# Patient Record
Sex: Male | Born: 1960 | Race: White | Hispanic: No | Marital: Married | Smoking: Current every day smoker
Health system: Southern US, Community
[De-identification: ages and names within clinical notes are randomized; demographics above are authoritative.]

## PROBLEM LIST (undated history)

## (undated) DIAGNOSIS — Z87898 Personal history of other specified conditions: Secondary | ICD-10-CM

## (undated) HISTORY — DX: Personal history of other specified conditions: Z87.898

---

## 1998-03-28 ENCOUNTER — Emergency Department (HOSPITAL_COMMUNITY): Admission: EM | Admit: 1998-03-28 | Discharge: 1998-03-28 | Payer: Self-pay | Admitting: Internal Medicine

## 2005-01-28 ENCOUNTER — Emergency Department: Payer: Self-pay | Admitting: Emergency Medicine

## 2005-02-28 ENCOUNTER — Emergency Department: Payer: Self-pay | Admitting: Emergency Medicine

## 2005-03-15 ENCOUNTER — Emergency Department: Payer: Self-pay | Admitting: Unknown Physician Specialty

## 2010-03-14 ENCOUNTER — Emergency Department: Payer: Self-pay | Admitting: Emergency Medicine

## 2011-04-13 ENCOUNTER — Emergency Department: Payer: Self-pay | Admitting: Emergency Medicine

## 2017-09-23 ENCOUNTER — Emergency Department (HOSPITAL_COMMUNITY): Payer: No Typology Code available for payment source

## 2017-09-23 ENCOUNTER — Other Ambulatory Visit: Payer: Self-pay

## 2017-09-23 ENCOUNTER — Inpatient Hospital Stay (HOSPITAL_COMMUNITY)
Admission: EM | Admit: 2017-09-23 | Discharge: 2017-09-30 | DRG: 958 | Disposition: A | Discharge status: Home Health | Payer: No Typology Code available for payment source | Attending: General Surgery | Admitting: General Surgery

## 2017-09-23 ENCOUNTER — Encounter (HOSPITAL_COMMUNITY): Payer: Self-pay | Admitting: Emergency Medicine

## 2017-09-23 DIAGNOSIS — S064X9A Epidural hemorrhage with loss of consciousness of unspecified duration, initial encounter: Secondary | ICD-10-CM | POA: Diagnosis present

## 2017-09-23 DIAGNOSIS — S27329A Contusion of lung, unspecified, initial encounter: Secondary | ICD-10-CM | POA: Diagnosis present

## 2017-09-23 DIAGNOSIS — G8194 Hemiplegia, unspecified affecting left nondominant side: Secondary | ICD-10-CM | POA: Diagnosis present

## 2017-09-23 DIAGNOSIS — R079 Chest pain, unspecified: Secondary | ICD-10-CM

## 2017-09-23 DIAGNOSIS — S12111A Posterior displaced Type II dens fracture, initial encounter for closed fracture: Secondary | ICD-10-CM

## 2017-09-23 DIAGNOSIS — Z9889 Other specified postprocedural states: Secondary | ICD-10-CM | POA: Diagnosis present

## 2017-09-23 DIAGNOSIS — S51011A Laceration without foreign body of right elbow, initial encounter: Secondary | ICD-10-CM | POA: Diagnosis present

## 2017-09-23 DIAGNOSIS — Y92410 Unspecified street and highway as the place of occurrence of the external cause: Secondary | ICD-10-CM

## 2017-09-23 DIAGNOSIS — S129XXA Fracture of neck, unspecified, initial encounter: Secondary | ICD-10-CM

## 2017-09-23 DIAGNOSIS — F17200 Nicotine dependence, unspecified, uncomplicated: Secondary | ICD-10-CM | POA: Diagnosis present

## 2017-09-23 DIAGNOSIS — S14109A Unspecified injury at unspecified level of cervical spinal cord, initial encounter: Secondary | ICD-10-CM | POA: Diagnosis present

## 2017-09-23 DIAGNOSIS — F1490 Cocaine use, unspecified, uncomplicated: Secondary | ICD-10-CM | POA: Diagnosis present

## 2017-09-23 DIAGNOSIS — M4802 Spinal stenosis, cervical region: Secondary | ICD-10-CM | POA: Diagnosis present

## 2017-09-23 DIAGNOSIS — R52 Pain, unspecified: Secondary | ICD-10-CM

## 2017-09-23 DIAGNOSIS — S12100A Unspecified displaced fracture of second cervical vertebra, initial encounter for closed fracture: Secondary | ICD-10-CM | POA: Diagnosis present

## 2017-09-23 DIAGNOSIS — Z87828 Personal history of other (healed) physical injury and trauma: Secondary | ICD-10-CM | POA: Diagnosis present

## 2017-09-23 DIAGNOSIS — Z91013 Allergy to seafood: Secondary | ICD-10-CM

## 2017-09-23 DIAGNOSIS — Z419 Encounter for procedure for purposes other than remedying health state, unspecified: Secondary | ICD-10-CM

## 2017-09-23 LAB — CBC
HEMATOCRIT: 50.2 % (ref 39.0–52.0)
Hemoglobin: 16.3 g/dL (ref 13.0–17.0)
MCH: 29.5 pg (ref 26.0–34.0)
MCHC: 32.5 g/dL (ref 30.0–36.0)
MCV: 90.8 fL (ref 78.0–100.0)
Platelets: 325 10*3/uL (ref 150–400)
RBC: 5.53 MIL/uL (ref 4.22–5.81)
RDW: 14 % (ref 11.5–15.5)
WBC: 27.3 10*3/uL — AB (ref 4.0–10.5)

## 2017-09-23 LAB — COMPREHENSIVE METABOLIC PANEL
ALT: 20 U/L (ref 17–63)
ANION GAP: 9 (ref 5–15)
AST: 25 U/L (ref 15–41)
Albumin: 3.9 g/dL (ref 3.5–5.0)
Alkaline Phosphatase: 83 U/L (ref 38–126)
BILIRUBIN TOTAL: 0.5 mg/dL (ref 0.3–1.2)
BUN: 28 mg/dL — AB (ref 6–20)
CO2: 21 mmol/L — ABNORMAL LOW (ref 22–32)
Calcium: 8.6 mg/dL — ABNORMAL LOW (ref 8.9–10.3)
Chloride: 109 mmol/L (ref 101–111)
Creatinine, Ser: 1.02 mg/dL (ref 0.61–1.24)
GFR calc Af Amer: >60 mL/min (ref >60)
GFR calc non Af Amer: >60 mL/min (ref >60)
Glucose, Bld: 129 mg/dL — ABNORMAL HIGH (ref 65–99)
POTASSIUM: 4.1 mmol/L (ref 3.5–5.1)
Sodium: 139 mmol/L (ref 135–145)
TOTAL PROTEIN: 6.8 g/dL (ref 6.5–8.1)

## 2017-09-23 LAB — URINALYSIS, ROUTINE W REFLEX MICROSCOPIC
BILIRUBIN URINE: NEGATIVE
Glucose, UA: NEGATIVE mg/dL
Hgb urine dipstick: NEGATIVE
KETONES UR: NEGATIVE mg/dL
Leukocytes, UA: NEGATIVE
NITRITE: NEGATIVE
PROTEIN: NEGATIVE mg/dL
SPECIFIC GRAVITY, URINE: 1.014 (ref 1.005–1.030)
pH: 7 (ref 5.0–8.0)

## 2017-09-23 LAB — PROTIME-INR
INR: 0.94
PROTHROMBIN TIME: 12.4 s (ref 11.4–15.2)

## 2017-09-23 LAB — ETHANOL: Alcohol, Ethyl (B): <10 mg/dL (ref <10)

## 2017-09-23 LAB — I-STAT CG4 LACTIC ACID, ED: LACTIC ACID, VENOUS: 0.85 mmol/L (ref 0.5–1.9)

## 2017-09-23 MED ORDER — FENTANYL CITRATE (PF) 100 MCG/2ML IJ SOLN
50.0000 ug | Freq: Once | INTRAMUSCULAR | Status: AC
  Administered 2017-09-23: 50 ug via INTRAVENOUS

## 2017-09-23 MED ORDER — MIDAZOLAM HCL 2 MG/2ML IJ SOLN
4.0000 mg | Freq: Once | INTRAMUSCULAR | Status: AC | PRN
  Administered 2017-09-23: 4 mg via INTRAVENOUS
  Filled 2017-09-23: qty 4

## 2017-09-23 MED ORDER — IOPAMIDOL (ISOVUE-300) INJECTION 61%
INTRAVENOUS | Status: AC
  Administered 2017-09-23: 100 mL
  Filled 2017-09-23: qty 100

## 2017-09-23 MED ORDER — MORPHINE SULFATE (PF) 4 MG/ML IV SOLN: 4.0000 mg | Freq: Once | INTRAVENOUS | Status: DC

## 2017-09-23 MED ORDER — IOPAMIDOL (ISOVUE-370) INJECTION 76%
INTRAVENOUS | Status: AC
  Filled 2017-09-23: qty 50

## 2017-09-23 MED ORDER — NALOXONE HCL 0.4 MG/ML IJ SOLN: 0.4000 mg | INTRAMUSCULAR | Status: DC | PRN

## 2017-09-23 MED ORDER — DOCUSATE SODIUM 100 MG PO CAPS: 100.0000 mg | ORAL_CAPSULE | Freq: Two times a day (BID) | ORAL | Status: DC

## 2017-09-23 MED ORDER — FENTANYL CITRATE (PF) 100 MCG/2ML IJ SOLN
INTRAMUSCULAR | Status: AC
  Filled 2017-09-23: qty 2

## 2017-09-23 MED ORDER — HYDROMORPHONE HCL 1 MG/ML IJ SOLN
1.0000 mg | Freq: Once | INTRAMUSCULAR | Status: AC
  Administered 2017-09-23: 1 mg via INTRAVENOUS

## 2017-09-23 MED ORDER — HYDRALAZINE HCL 20 MG/ML IJ SOLN: 10.0000 mg | INTRAMUSCULAR | Status: DC | PRN

## 2017-09-23 MED ORDER — OXYCODONE HCL 5 MG PO TABS
5.0000 mg | ORAL_TABLET | ORAL | Status: DC | PRN
  Administered 2017-09-24 – 2017-09-26 (×5): 5 mg via ORAL
  Filled 2017-09-23 (×6): qty 1

## 2017-09-23 MED ORDER — ONDANSETRON HCL 4 MG/2ML IJ SOLN: 4.0000 mg | Freq: Four times a day (QID) | INTRAMUSCULAR | Status: DC | PRN

## 2017-09-23 MED ORDER — ONDANSETRON 4 MG PO TBDP: 4.0000 mg | ORAL_TABLET | Freq: Four times a day (QID) | ORAL | Status: DC | PRN

## 2017-09-23 MED ORDER — HYDROMORPHONE HCL 1 MG/ML IJ SOLN
1.0000 mg | INTRAMUSCULAR | Status: AC | PRN
  Administered 2017-09-23 – 2017-09-24 (×3): 1 mg via INTRAVENOUS
  Filled 2017-09-23 (×6): qty 1

## 2017-09-23 MED ORDER — MORPHINE SULFATE (PF) 4 MG/ML IV SOLN
2.0000 mg | INTRAVENOUS | Status: DC | PRN
  Administered 2017-09-24 – 2017-09-27 (×24): 4 mg via INTRAVENOUS
  Filled 2017-09-23 (×24): qty 1

## 2017-09-23 MED ORDER — HYDROMORPHONE HCL 1 MG/ML IJ SOLN
2.0000 mg | Freq: Once | INTRAMUSCULAR | Status: AC | PRN
  Administered 2017-09-23: 2 mg via INTRAVENOUS
  Filled 2017-09-23: qty 2

## 2017-09-23 MED ORDER — MORPHINE SULFATE (PF) 4 MG/ML IV SOLN
4.0000 mg | Freq: Once | INTRAVENOUS | Status: AC
  Administered 2017-09-23: 4 mg via INTRAVENOUS
  Filled 2017-09-23: qty 1

## 2017-09-23 MED ORDER — ACETAMINOPHEN 325 MG PO TABS: 650.0000 mg | ORAL_TABLET | ORAL | Status: DC | PRN

## 2017-09-23 MED ORDER — LIDOCAINE-EPINEPHRINE (PF) 2 %-1:200000 IJ SOLN
30.0000 mL | Freq: Once | INTRAMUSCULAR | Status: AC
  Administered 2017-09-23: 30 mL via INTRADERMAL
  Filled 2017-09-23: qty 40

## 2017-09-23 MED ORDER — SODIUM CHLORIDE 0.9 % IV BOLUS (SEPSIS)
1000.0000 mL | Freq: Once | INTRAVENOUS | Status: AC
  Administered 2017-09-23: 1000 mL via INTRAVENOUS

## 2017-09-23 NOTE — ED Notes (Signed)
Pt stated to RN "Can't I have some f&^king water?! I mean just a little bit yall are making me dye of f%$king thirst!" Pt informed he may have water after the MD sees the CT results and okays PO liquids.

## 2017-09-23 NOTE — ED Notes (Signed)
Report received, care assumed. [facility] Pt sedated/ sleeping with sonorous resps. NAD, calm, resps e/u, no dyspnea noted. VSS. C-collar remains. Head maintained in traction. HOB 30 degrees. [facility] Pending admission orders.

## 2017-09-23 NOTE — ED Notes (Signed)
Garnder-wells traction placed with 5lbs weight
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
## 2017-09-23 NOTE — Progress Notes (Signed)
Orthopedic Tech Progress Note Patient Details:  Frederick EtienneBryan Warren [birth year] [id number]  Musculoskeletal Traction Type of Traction: Other (Comment) Traction Location: head Traction Weight: 5 lbs   Post Interventions Patient Tolerated: Well Instructions Provided: Care of device   Jennye MoccasinHughes, [name] 09/23/2017, 11:22 PM

## 2017-09-23 NOTE — ED Notes (Signed)
Patient transported to CT and xray 

## 2017-09-23 NOTE — ED Notes (Signed)
Against advisory pt removed c-collar because "I couldn't f&^%ing breathe with it on"

## 2017-09-23 NOTE — ED Notes (Signed)
MRI will get pt for scan in 20 mins Message sent to pharmacy to verify pain meds

## 2017-09-23 NOTE — ED Notes (Signed)
Ortho tech not sure where to get [employer] traction tongs, OR contacted and will call back

## 2017-09-23 NOTE — Procedures (Signed)
Consent was obtained. The patient was sedated. The supraauricular area was cleaned with chlorhexidine and infiltrated with lidocaine 1% with 1:100,00 epi. Tongs were placed until appropriately tightened.  They were secured with the locking nuts. 5 lbs of weight was added to the traction mechanism. The patient tolerated the procedure well.

## 2017-09-23 NOTE — ED Notes (Signed)
Patient transported to CT 

## 2017-09-23 NOTE — ED Notes (Signed)
MD, ortho tech, and PA bedside to initiate traction
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
## 2017-09-24 ENCOUNTER — Inpatient Hospital Stay (HOSPITAL_COMMUNITY): Payer: No Typology Code available for payment source

## 2017-09-24 ENCOUNTER — Other Ambulatory Visit: Payer: Self-pay | Admitting: Neurological Surgery

## 2017-09-24 ENCOUNTER — Encounter (HOSPITAL_COMMUNITY): Payer: Self-pay | Admitting: Emergency Medicine

## 2017-09-24 LAB — MRSA PCR SCREENING: MRSA by PCR: NEGATIVE

## 2017-09-24 LAB — HIV ANTIBODY (ROUTINE TESTING W REFLEX): HIV Screen 4th Generation wRfx: NONREACTIVE

## 2017-09-24 MED ORDER — LORAZEPAM 2 MG/ML IJ SOLN
2.0000 mg | INTRAMUSCULAR | Status: DC | PRN
  Administered 2017-09-27 (×2): 2 mg via INTRAVENOUS
  Filled 2017-09-24: qty 2
  Filled 2017-09-24 (×2): qty 1

## 2017-09-24 MED ORDER — FOLIC ACID 5 MG/ML IJ SOLN
1.0000 mg | Freq: Every day | INTRAMUSCULAR | Status: DC
  Administered 2017-09-25: 1 mg via INTRAVENOUS
  Filled 2017-09-24 (×3): qty 0.2

## 2017-09-24 MED ORDER — THIAMINE HCL 100 MG/ML IJ SOLN
100.0000 mg | Freq: Every day | INTRAMUSCULAR | Status: DC
Start: 2017-09-24 — End: 2017-09-25
  Administered 2017-09-24 – 2017-09-25 (×2): 100 mg via INTRAVENOUS
  Filled 2017-09-24 (×2): qty 2

## 2017-09-24 MED ORDER — DIAZEPAM 1 MG/ML PO SOLN
5.0000 mg | Freq: Four times a day (QID) | ORAL | Status: DC
  Administered 2017-09-24 – 2017-09-27 (×12): 5 mg via ORAL
  Filled 2017-09-24 (×13): qty 5

## 2017-09-24 MED ORDER — DOCUSATE SODIUM 50 MG/5ML PO LIQD
100.0000 mg | Freq: Two times a day (BID) | ORAL | Status: DC
  Administered 2017-09-24 – 2017-09-27 (×6): 100 mg via ORAL
  Filled 2017-09-24 (×6): qty 10

## 2017-09-24 MED ORDER — DEXTROSE 5 % IV SOLN
750.0000 mg | Freq: Four times a day (QID) | INTRAVENOUS | Status: DC
  Administered 2017-09-24 – 2017-09-27 (×13): 750 mg via INTRAVENOUS
  Filled 2017-09-24 (×16): qty 7.5
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
## 2017-09-24 NOTE — ED Notes (Signed)
Pt c/o tingling in left arm.  Pt moving all extremities.  Left hand grip weaker than right.
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
## 2017-09-24 NOTE — Progress Notes (Signed)
Complains of pain Neurologically unchanged; persistent left arm weakness Xray shows no reduction with 5 pounds of traction, increased to 10 pounds. Will check xray this afternoon and increase to 15 pounds if no change.  Will check additional xray in the morning. If he reduces will plan odontoid screw fixation, if he does not reduce will perform dorsal cervical decompression and fusion.  Likely Tuesday.

## 2017-09-25 ENCOUNTER — Inpatient Hospital Stay (HOSPITAL_COMMUNITY): Payer: No Typology Code available for payment source

## 2017-09-25 ENCOUNTER — Other Ambulatory Visit: Payer: Self-pay

## 2017-09-25 MED ORDER — VITAMIN B-1 100 MG PO TABS
100.0000 mg | ORAL_TABLET | Freq: Every day | ORAL | Status: DC
  Administered 2017-09-26 – 2017-09-30 (×5): 100 mg via ORAL
  Filled 2017-09-25 (×6): qty 1

## 2017-09-25 MED ORDER — FOLIC ACID 1 MG PO TABS
1.0000 mg | ORAL_TABLET | Freq: Every day | ORAL | Status: DC
  Administered 2017-09-26 – 2017-09-30 (×5): 1 mg via ORAL
  Filled 2017-09-25 (×5): qty 1
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
## 2017-09-25 NOTE — Progress Notes (Signed)
PT Cancellation Note  Patient Details Name: Rolla EtienneBryan Bergren MRN: [id number] DOB: [birth year]   Cancelled Treatment:    Reason Eval/Treat Not Completed: Medical issues which prohibited therapy  Has to stay immobile. 09/25/2017  Birgit [place] BingKen Tariya Morrissette, PT [phone]  (pager)   Eliseo GumKenneth V Vola Beneke 09/25/2017, 11:20 AM
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
## 2017-09-26 LAB — CBC WITH DIFFERENTIAL/PLATELET
Basophils Absolute: 0 10*3/uL (ref 0.0–0.1)
Basophils Relative: 0 %
Eosinophils Absolute: 0.1 10*3/uL (ref 0.0–0.7)
Eosinophils Relative: 1 %
HEMATOCRIT: 47.9 % (ref 39.0–52.0)
HEMOGLOBIN: 16.1 g/dL (ref 13.0–17.0)
LYMPHS ABS: 2.1 10*3/uL (ref 0.7–4.0)
LYMPHS PCT: 17 %
MCH: 30.7 pg (ref 26.0–34.0)
MCHC: 33.6 g/dL (ref 30.0–36.0)
MCV: 91.4 fL (ref 78.0–100.0)
Monocytes Absolute: 1.4 10*3/uL (ref 0.1–1.0)
Monocytes Relative: 12 %
NEUTROS ABS: 8.7 10*3/uL (ref 1.7–7.7)
NEUTROS PCT: 70 %
Platelets: 268 10*3/uL (ref 150–400)
RBC: 5.24 MIL/uL (ref 4.22–5.81)
RDW: 13.8 % (ref 11.5–15.5)
WBC: 12.3 10*3/uL — ABNORMAL HIGH (ref 4.0–10.5)

## 2017-09-26 LAB — BASIC METABOLIC PANEL
Anion gap: 10 (ref 5–15)
BUN: 16 mg/dL (ref 6–20)
CHLORIDE: 99 mmol/L — AB (ref 101–111)
CO2: 24 mmol/L (ref 22–32)
CREATININE: 0.84 mg/dL (ref 0.61–1.24)
Calcium: 8.9 mg/dL (ref 8.9–10.3)
GFR calc Af Amer: >60 mL/min (ref >60)
GFR calc non Af Amer: >60 mL/min (ref >60)
GLUCOSE: 139 mg/dL — AB (ref 65–99)
Potassium: 3.9 mmol/L (ref 3.5–5.1)
Sodium: 133 mmol/L — ABNORMAL LOW (ref 135–145)

## 2017-09-26 MED ORDER — DIPHENHYDRAMINE HCL 50 MG/ML IJ SOLN
50.0000 mg | Freq: Four times a day (QID) | INTRAMUSCULAR | Status: DC | PRN
  Administered 2017-09-26 – 2017-09-28 (×2): 50 mg via INTRAVENOUS
  Filled 2017-09-26 (×2): qty 1

## 2017-09-26 NOTE — Progress Notes (Signed)
Subjective: The patient is alert.  His mother is at the bedside.  [facility] is frustrated [facility]  Objective: Vital signs in last 24 hours: Temp:  [98 F (36.7 C)-98.7 F (37.1 C)] 98.4 F (36.9 C) (01/20 0800) Pulse Rate:  [93-106] 105 (01/20 0800) Resp:  [9-14] 9 (01/20 0800) BP: (120-140)/(78-90) 120/87 (01/20 0800) SpO2:  [95 %-99 %] 97 % (01/20 0800) Estimated body mass index is 21.99 kg/m as calculated from the following:   Height as of this encounter: 5\' 7"  (1.702 m).   Weight as of this encounter: 63.7 kg (140 lb 6.9 oz).   Intake/Output from previous day: 01/19 0701 - 01/20 0700 In: 380 [P.O.:150; IV Piggyback:230] Out: 1235 [Urine:1235] Intake/Output this shift: Total I/O In: 0  Out: 150 [Urine:150]  Physical exam the patient is alert and oriented.  Moving all 4 extremities.  His left hand continues to be weak.  The Gardner-Wells traction is in place.  Lab Results: Recent Labs    09/23/17 1658  WBC 27.3*  HGB 16.3  HCT 50.2  PLT 325   BMET Recent Labs    09/23/17 1658  NA 139  K 4.1  CL 109  CO2 21*  GLUCOSE 129*  BUN 28*  CREATININE 1.02  CALCIUM 8.6*    Studies/Results: Dg Spine Portable 1 View  Result Date: 09/25/2017 CLINICAL DATA:  Cervical spine fracture. EXAM: PORTABLE SPINE - 1 VIEW COMPARISON:  CT, MRI, and radiography from yesterday and the day prior. FINDINGS: Minimal if any reduction of type 2 dens fracture that is posteriorly displaced by approximately 7 mm. No new osseous abnormality. IMPRESSION: Type 2 dens fracture with posterior displacement that is similar to immediate prior and improved from admission. Electronically Signed   By: Marnee SpringJonathon  Watts M.D.   On: 09/25/2017 07:52   Dg Spine Portable 1 View  Result Date: 09/24/2017 CLINICAL DATA:  Odontoid fracture. EXAM: PORTABLE SPINE - 1 VIEW COMPARISON:  09/24/2017, 0600 FINDINGS: Posterior displacement of the odontoid fragment appears less prominent  and now measures approximately 10 mm compared to 13 mm on the prior film. No other fractures identified. IMPRESSION: Less prominent posterior displacement of the odontoid fragment. Electronically Signed   By: Irish LackGlenn  Yamagata M.D.   On: 09/24/2017 15:08    Assessment/Plan: Type II odontoid fracture: Dr. Bevely Palmeritty will be by tomorrow to discuss surgery with him.  I told the patient that he, or his family, [facility] if he so desires.  LOS: 3 days     Cristi LoronJeffrey D Lester Platas 09/26/2017, 9:02 AM     Patient ID: Frederick Warren, male   DOB: 1960-09-27, 57 y.o.   MRN: [id number]

## 2017-09-26 NOTE — Plan of Care (Signed)
  Clinical Measurements: Ability to maintain clinical measurements within normal limits will improve 09/26/2017 2039 - Progressing by Luther Redourgott, Kaleb Sek, RN   Coping: Level of anxiety will decrease 09/26/2017 2039 - Progressing by Luther Redourgott, Lundynn Cohoon, RN   Pain Managment: General experience of comfort will improve 09/26/2017 2039 - Progressing by Luther Redourgott, Ewel Lona, RN

## 2017-09-26 NOTE — Progress Notes (Signed)
PT Cancellation Note  Patient Details Name: Frederick Warren MRN: [id number] DOB: 03/25/1961   Cancelled Treatment:    Reason Eval/Treat Not Completed: Medical issues which prohibited therapy   Pt must continue to remain immobilized in traction; noted for surgery this upcoming week;   Frederick Warren, PT  Acute Rehabilitation Services Pager [phone] Office [phone]    Frederick Warren 09/26/2017, 8:36 AM
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
## 2017-09-26 NOTE — Progress Notes (Signed)
Trauma Service Note  Subjective: Patient on the schedule for 11:00 AM tomorrow.  Objective: Vital signs in last 24 hours: Temp:  [98 F (36.7 C)-98.7 F (37.1 C)] 98.4 F (36.9 C) (01/20 0800) Pulse Rate:  [93-106] 105 (01/20 0800) Resp:  [9-14] 9 (01/20 0800) BP: (120-140)/(78-90) 120/87 (01/20 0800) SpO2:  [95 %-99 %] 97 % (01/20 0800) Last BM Date: 09/23/17  Intake/Output from previous day: 01/19 0701 - 01/20 0700 In: 380 [P.O.:150; IV Piggyback:230] Out: 1235 [Urine:1235] Intake/Output this shift: Total I/O In: 57.5 [IV Piggyback:57.5] Out: 150 [Urine:150]  General: No distress  Lungs: Clear  Abd: Benign  Extremities: No changes  Neuro: Intact  Lab Results: CBC  Recent Labs    09/23/17 1658  WBC 27.3*  HGB 16.3  HCT 50.2  PLT 325   BMET Recent Labs    09/23/17 1658  NA 139  K 4.1  CL 109  CO2 21*  GLUCOSE 129*  BUN 28*  CREATININE 1.02  CALCIUM 8.6*   PT/INR Recent Labs    09/23/17 1658  LABPROT 12.4  INR 0.94   ABG No results for input(s): PHART, HCO3 in the last 72 hours.  Invalid input(s): PCO2, PO2  Studies/Results: Dg Spine Portable 1 View  Result Date: 09/25/2017 CLINICAL DATA:  Cervical spine fracture. EXAM: PORTABLE SPINE - 1 VIEW COMPARISON:  CT, MRI, and radiography from yesterday and the day prior. FINDINGS: Minimal if any reduction of type 2 dens fracture that is posteriorly displaced by approximately 7 mm. No new osseous abnormality. IMPRESSION: Type 2 dens fracture with posterior displacement that is similar to immediate prior and improved from admission. Electronically Signed   By: Marnee SpringJonathon  Watts M.D.   On: 09/25/2017 07:52   Dg Spine Portable 1 View  Result Date: 09/24/2017 CLINICAL DATA:  Odontoid fracture. EXAM: PORTABLE SPINE - 1 VIEW COMPARISON:  09/24/2017, 0600 FINDINGS: Posterior displacement of the odontoid fragment appears less prominent and now measures approximately 10 mm compared to 13 mm on the prior film.  No other fractures identified. IMPRESSION: Less prominent posterior displacement of the odontoid fragment. Electronically Signed   By: Irish LackGlenn  Yamagata M.D.   On: 09/24/2017 15:08    Anti-infectives: Anti-infectives (From admission, onward)   None      Assessment/Plan: s/p Procedure(s): ODONTOID SCREW Fixation vs. C1-2 Posterior Cerivcal Decompression with Fixation NPO after midnight.  LOS: 3 days   Frederick LamasJames O. Gae Warren, III, MD, FACS [phone] Trauma Surgeon 09/26/2017

## 2017-09-27 ENCOUNTER — Encounter (HOSPITAL_COMMUNITY): Admission: EM | Disposition: A | Discharge status: Home Health | Payer: Self-pay | Source: Home / Self Care

## 2017-09-27 ENCOUNTER — Encounter (HOSPITAL_COMMUNITY): Payer: Self-pay | Admitting: Certified Registered Nurse Anesthetist

## 2017-09-27 ENCOUNTER — Inpatient Hospital Stay (HOSPITAL_COMMUNITY): Payer: No Typology Code available for payment source | Admitting: Certified Registered Nurse Anesthetist

## 2017-09-27 ENCOUNTER — Inpatient Hospital Stay (HOSPITAL_COMMUNITY): Payer: No Typology Code available for payment source

## 2017-09-27 HISTORY — PX: ODONTOID SCREW INSERTION: SHX5704

## 2017-09-27 LAB — BASIC METABOLIC PANEL
Anion gap: 10 (ref 5–15)
BUN: 17 mg/dL (ref 6–20)
CHLORIDE: 100 mmol/L — AB (ref 101–111)
CO2: 25 mmol/L (ref 22–32)
Calcium: 8.9 mg/dL (ref 8.9–10.3)
Creatinine, Ser: 0.85 mg/dL (ref 0.61–1.24)
GFR calc Af Amer: >60 mL/min (ref >60)
GLUCOSE: 107 mg/dL — AB (ref 65–99)
POTASSIUM: 4 mmol/L (ref 3.5–5.1)
Sodium: 135 mmol/L (ref 135–145)

## 2017-09-27 LAB — CBC WITH DIFFERENTIAL/PLATELET
Basophils Absolute: 0 10*3/uL (ref 0.0–0.1)
Basophils Relative: 0 %
EOS PCT: 1 %
Eosinophils Absolute: 0.2 10*3/uL (ref 0.0–0.7)
HCT: 47.7 % (ref 39.0–52.0)
Hemoglobin: 16.1 g/dL (ref 13.0–17.0)
Lymphocytes Relative: 17 %
Lymphs Abs: 2.2 10*3/uL (ref 0.7–4.0)
MCH: 30.6 pg (ref 26.0–34.0)
MCHC: 33.8 g/dL (ref 30.0–36.0)
MCV: 90.7 fL (ref 78.0–100.0)
Monocytes Absolute: 2.1 10*3/uL — ABNORMAL HIGH (ref 0.1–1.0)
Monocytes Relative: 16 %
Neutro Abs: 8.6 10*3/uL — ABNORMAL HIGH (ref 1.7–7.7)
Neutrophils Relative %: 66 %
PLATELETS: 268 10*3/uL (ref 150–400)
RBC: 5.26 MIL/uL (ref 4.22–5.81)
RDW: 13.4 % (ref 11.5–15.5)
WBC: 13.1 10*3/uL — AB (ref 4.0–10.5)

## 2017-09-27 SURGERY — INSERTION, SCREW, ODONTOID
Anesthesia: General | Site: Neck

## 2017-09-27 MED ORDER — DIAZEPAM 5 MG PO TABS
5.0000 mg | ORAL_TABLET | Freq: Four times a day (QID) | ORAL | Status: DC | PRN
  Administered 2017-09-28 – 2017-09-29 (×2): 5 mg via ORAL
  Filled 2017-09-27 (×2): qty 1

## 2017-09-27 MED ORDER — SODIUM CHLORIDE 0.9 % IR SOLN
Status: DC | PRN
  Administered 2017-09-27: 500 mL

## 2017-09-27 MED ORDER — KETAMINE HCL 10 MG/ML IJ SOLN
INTRAMUSCULAR | Status: DC | PRN
  Administered 2017-09-27: 30 mg via INTRAVENOUS
  Administered 2017-09-27: 10 mg via INTRAVENOUS

## 2017-09-27 MED ORDER — BUPIVACAINE-EPINEPHRINE (PF) 0.5% -1:200000 IJ SOLN
INTRAMUSCULAR | Status: AC
  Filled 2017-09-27: qty 30

## 2017-09-27 MED ORDER — PHENOL 1.4 % MT LIQD: 1.0000 | OROMUCOSAL | Status: DC | PRN

## 2017-09-27 MED ORDER — MENTHOL 3 MG MT LOZG: 1.0000 | LOZENGE | OROMUCOSAL | Status: DC | PRN

## 2017-09-27 MED ORDER — OXYCODONE HCL ER 15 MG PO T12A
15.0000 mg | EXTENDED_RELEASE_TABLET | Freq: Two times a day (BID) | ORAL | Status: DC
  Administered 2017-09-27 – 2017-09-30 (×6): 15 mg via ORAL
  Filled 2017-09-27 (×6): qty 1

## 2017-09-27 MED ORDER — DEXAMETHASONE SODIUM PHOSPHATE 10 MG/ML IJ SOLN
INTRAMUSCULAR | Status: DC | PRN
  Administered 2017-09-27: 10 mg via INTRAVENOUS

## 2017-09-27 MED ORDER — CEFAZOLIN SODIUM-DEXTROSE 2-4 GM/100ML-% IV SOLN
2.0000 g | INTRAVENOUS | Status: AC
  Administered 2017-09-27: 2 g via INTRAVENOUS
  Filled 2017-09-27 (×2): qty 100

## 2017-09-27 MED ORDER — FLEET ENEMA 7-19 GM/118ML RE ENEM: 1.0000 | ENEMA | Freq: Once | RECTAL | Status: DC | PRN

## 2017-09-27 MED ORDER — GABAPENTIN 300 MG PO CAPS
300.0000 mg | ORAL_CAPSULE | Freq: Three times a day (TID) | ORAL | Status: DC
Start: 2017-09-27 — End: 2017-09-30
  Administered 2017-09-27 – 2017-09-30 (×9): 300 mg via ORAL
  Filled 2017-09-27 (×9): qty 1

## 2017-09-27 MED ORDER — MORPHINE SULFATE (PF) 4 MG/ML IV SOLN
2.0000 mg | INTRAVENOUS | Status: DC | PRN
  Administered 2017-09-27 – 2017-09-28 (×2): 4 mg via INTRAVENOUS
  Filled 2017-09-27 (×2): qty 1

## 2017-09-27 MED ORDER — LIDOCAINE HCL (CARDIAC) 20 MG/ML IV SOLN
INTRAVENOUS | Status: DC | PRN
  Administered 2017-09-27: 200 mg via INTRATRACHEAL

## 2017-09-27 MED ORDER — KETAMINE HCL-SODIUM CHLORIDE 100-0.9 MG/10ML-% IV SOSY
PREFILLED_SYRINGE | INTRAVENOUS | Status: AC
  Filled 2017-09-27: qty 10

## 2017-09-27 MED ORDER — PHENYLEPHRINE HCL 10 MG/ML IJ SOLN
INTRAVENOUS | Status: DC | PRN
  Administered 2017-09-27: 20 ug/min via INTRAVENOUS

## 2017-09-27 MED ORDER — MIDAZOLAM HCL 5 MG/5ML IJ SOLN
INTRAMUSCULAR | Status: DC | PRN
  Administered 2017-09-27: 1 mg via INTRAVENOUS

## 2017-09-27 MED ORDER — PANTOPRAZOLE SODIUM 40 MG IV SOLR
40.0000 mg | Freq: Every day | INTRAVENOUS | Status: DC
  Administered 2017-09-27 – 2017-09-28 (×2): 40 mg via INTRAVENOUS
  Filled 2017-09-27 (×2): qty 40

## 2017-09-27 MED ORDER — ONDANSETRON HCL 4 MG PO TABS: 4.0000 mg | ORAL_TABLET | Freq: Four times a day (QID) | ORAL | Status: DC | PRN

## 2017-09-27 MED ORDER — LIDOCAINE-EPINEPHRINE 2 %-1:100000 IJ SOLN
INTRAMUSCULAR | Status: AC
  Filled 2017-09-27: qty 1

## 2017-09-27 MED ORDER — DEXAMETHASONE SODIUM PHOSPHATE 4 MG/ML IJ SOLN: 2.0000 mg | Freq: Three times a day (TID) | INTRAMUSCULAR | Status: DC

## 2017-09-27 MED ORDER — THROMBIN (RECOMBINANT) 5000 UNITS EX SOLR
OROMUCOSAL | Status: DC | PRN
  Administered 2017-09-27: 5 mL via TOPICAL

## 2017-09-27 MED ORDER — ONDANSETRON HCL 4 MG/2ML IJ SOLN: 4.0000 mg | Freq: Four times a day (QID) | INTRAMUSCULAR | Status: DC | PRN

## 2017-09-27 MED ORDER — DOCUSATE SODIUM 100 MG PO CAPS
100.0000 mg | ORAL_CAPSULE | Freq: Two times a day (BID) | ORAL | Status: DC
  Administered 2017-09-28 – 2017-09-30 (×5): 100 mg via ORAL
  Filled 2017-09-27 (×6): qty 1

## 2017-09-27 MED ORDER — PHENYLEPHRINE HCL 10 MG/ML IJ SOLN
INTRAMUSCULAR | Status: DC | PRN
  Administered 2017-09-27: 120 ug via INTRAVENOUS
  Administered 2017-09-27: 10 ug via INTRAVENOUS
  Administered 2017-09-27 (×3): 80 ug via INTRAVENOUS

## 2017-09-27 MED ORDER — SODIUM CHLORIDE 0.9% FLUSH: 3.0000 mL | INTRAVENOUS | Status: DC | PRN

## 2017-09-27 MED ORDER — ZOLPIDEM TARTRATE 5 MG PO TABS
5.0000 mg | ORAL_TABLET | Freq: Every evening | ORAL | Status: DC | PRN
  Administered 2017-09-29 (×2): 5 mg via ORAL
  Filled 2017-09-27 (×2): qty 1

## 2017-09-27 MED ORDER — FENTANYL CITRATE (PF) 250 MCG/5ML IJ SOLN
INTRAMUSCULAR | Status: AC
Start: 2017-09-27 — End: ?
  Filled 2017-09-27: qty 5

## 2017-09-27 MED ORDER — ACETAMINOPHEN 325 MG PO TABS: 650.0000 mg | ORAL_TABLET | ORAL | Status: DC

## 2017-09-27 MED ORDER — PROPOFOL 10 MG/ML IV BOLUS
INTRAVENOUS | Status: AC
  Filled 2017-09-27: qty 20

## 2017-09-27 MED ORDER — SODIUM CHLORIDE 0.9% FLUSH
3.0000 mL | Freq: Two times a day (BID) | INTRAVENOUS | Status: DC
  Administered 2017-09-27 – 2017-09-30 (×6): 3 mL via INTRAVENOUS

## 2017-09-27 MED ORDER — SUGAMMADEX SODIUM 200 MG/2ML IV SOLN
INTRAVENOUS | Status: DC | PRN
  Administered 2017-09-27: 150 mg via INTRAVENOUS

## 2017-09-27 MED ORDER — MIDAZOLAM HCL 2 MG/2ML IJ SOLN
INTRAMUSCULAR | Status: AC
  Filled 2017-09-27: qty 2

## 2017-09-27 MED ORDER — LACTATED RINGERS IV SOLN
INTRAVENOUS | Status: DC
  Administered 2017-09-27 (×2): via INTRAVENOUS

## 2017-09-27 MED ORDER — CEFAZOLIN SODIUM-DEXTROSE 1-4 GM/50ML-% IV SOLN
1.0000 g | Freq: Three times a day (TID) | INTRAVENOUS | Status: AC
  Administered 2017-09-27 – 2017-09-28 (×2): 1 g via INTRAVENOUS
  Filled 2017-09-27 (×2): qty 50

## 2017-09-27 MED ORDER — METHOCARBAMOL 750 MG PO TABS
750.0000 mg | ORAL_TABLET | Freq: Four times a day (QID) | ORAL | Status: DC
  Administered 2017-09-27 – 2017-09-30 (×10): 750 mg via ORAL
  Filled 2017-09-27 (×4): qty 1
  Filled 2017-09-27: qty 2
  Filled 2017-09-27 (×2): qty 1
  Filled 2017-09-27: qty 2
  Filled 2017-09-27: qty 1
  Filled 2017-09-27 (×3): qty 2
  Filled 2017-09-27: qty 1.5
  Filled 2017-09-27: qty 2
  Filled 2017-09-27: qty 1

## 2017-09-27 MED ORDER — SODIUM CHLORIDE 0.9 % IV SOLN: 250.0000 mL | INTRAVENOUS | Status: DC

## 2017-09-27 MED ORDER — DEXAMETHASONE 4 MG PO TABS
4.0000 mg | ORAL_TABLET | Freq: Four times a day (QID) | ORAL | Status: AC
  Administered 2017-09-27 (×2): 4 mg via ORAL
  Filled 2017-09-27 (×2): qty 1

## 2017-09-27 MED ORDER — OXYCODONE HCL 5 MG PO TABS: 5.0000 mg | ORAL_TABLET | ORAL | Status: DC | PRN

## 2017-09-27 MED ORDER — ACETAMINOPHEN 325 MG PO TABS
650.0000 mg | ORAL_TABLET | ORAL | Status: DC | PRN
Start: 2017-09-27 — End: 2017-09-30
  Filled 2017-09-27: qty 2

## 2017-09-27 MED ORDER — THROMBIN (RECOMBINANT) 5000 UNITS EX SOLR
CUTANEOUS | Status: DC | PRN
  Administered 2017-09-27: 5000 [IU] via TOPICAL

## 2017-09-27 MED ORDER — SODIUM CHLORIDE 0.9 % IV SOLN
INTRAVENOUS | Status: DC
  Administered 2017-09-27 (×2): via INTRAVENOUS

## 2017-09-27 MED ORDER — THROMBIN (RECOMBINANT) 5000 UNITS EX SOLR
CUTANEOUS | Status: AC
  Filled 2017-09-27: qty 15000

## 2017-09-27 MED ORDER — LIDOCAINE-EPINEPHRINE 2 %-1:100000 IJ SOLN
INTRAMUSCULAR | Status: DC | PRN
  Administered 2017-09-27: 6 mL

## 2017-09-27 MED ORDER — ONDANSETRON HCL 4 MG/2ML IJ SOLN
INTRAMUSCULAR | Status: DC | PRN
  Administered 2017-09-27: 4 mg via INTRAVENOUS

## 2017-09-27 MED ORDER — ACETAMINOPHEN 500 MG PO TABS
1000.0000 mg | ORAL_TABLET | Freq: Four times a day (QID) | ORAL | Status: DC
  Administered 2017-09-27 – 2017-09-30 (×11): 1000 mg via ORAL
  Filled 2017-09-27 (×11): qty 2

## 2017-09-27 MED ORDER — SENNA 8.6 MG PO TABS
1.0000 | ORAL_TABLET | Freq: Two times a day (BID) | ORAL | Status: DC
  Administered 2017-09-27 – 2017-09-30 (×6): 8.6 mg via ORAL
  Filled 2017-09-27 (×6): qty 1

## 2017-09-27 MED ORDER — PROPOFOL 10 MG/ML IV BOLUS
INTRAVENOUS | Status: DC | PRN
  Administered 2017-09-27: 60 mg via INTRAVENOUS

## 2017-09-27 MED ORDER — ACETAMINOPHEN 650 MG RE SUPP: 650.0000 mg | RECTAL | Status: DC | PRN

## 2017-09-27 MED ORDER — BISACODYL 10 MG RE SUPP: 10.0000 mg | Freq: Every day | RECTAL | Status: DC | PRN

## 2017-09-27 MED ORDER — OXYCODONE HCL 5 MG PO TABS
10.0000 mg | ORAL_TABLET | ORAL | Status: DC | PRN
  Administered 2017-09-28 – 2017-09-30 (×11): 10 mg via ORAL
  Filled 2017-09-27 (×11): qty 2

## 2017-09-27 MED ORDER — 0.9 % SODIUM CHLORIDE (POUR BTL) OPTIME
TOPICAL | Status: DC | PRN
  Administered 2017-09-27: 1000 mL

## 2017-09-27 MED ORDER — FENTANYL CITRATE (PF) 100 MCG/2ML IJ SOLN
INTRAMUSCULAR | Status: DC | PRN
  Administered 2017-09-27 (×2): 50 ug via INTRAVENOUS

## 2017-09-27 MED ORDER — ROCURONIUM BROMIDE 100 MG/10ML IV SOLN
INTRAVENOUS | Status: DC | PRN
  Administered 2017-09-27: 60 mg via INTRAVENOUS

## 2017-09-27 MED ORDER — CELECOXIB 200 MG PO CAPS
200.0000 mg | ORAL_CAPSULE | Freq: Two times a day (BID) | ORAL | Status: DC
Start: 2017-09-27 — End: 2017-09-30
  Administered 2017-09-28 – 2017-09-30 (×5): 200 mg via ORAL
  Filled 2017-09-27 (×7): qty 1

## 2017-09-27 MED ORDER — THROMBIN (RECOMBINANT) 5000 UNITS EX SOLR
CUTANEOUS | Status: AC
  Filled 2017-09-27: qty 5000

## 2017-09-27 MED ORDER — DEXAMETHASONE SODIUM PHOSPHATE 4 MG/ML IJ SOLN
2.0000 mg | Freq: Four times a day (QID) | INTRAMUSCULAR | Status: AC
  Filled 2017-09-27: qty 1

## 2017-09-27 SURGICAL SUPPLY — 60 items
BIT DRILL UCSC CANN STRL (BIT) ×1 IMPLANT
BUR MATCHSTICK NEURO 3.0 LAGG (BURR) ×3 IMPLANT
CANISTER SUCT 3000ML PPV (MISCELLANEOUS) ×3 IMPLANT
CARTRIDGE OIL MAESTRO DRILL (MISCELLANEOUS) ×1 IMPLANT
CHLORAPREP W/TINT 26ML (MISCELLANEOUS) ×3 IMPLANT
DECANTER SPIKE VIAL GLASS SM (MISCELLANEOUS) ×3 IMPLANT
DERMABOND ADVANCED (GAUZE/BANDAGES/DRESSINGS) ×2
DERMABOND ADVANCED .7 DNX12 (GAUZE/BANDAGES/DRESSINGS) ×1 IMPLANT
DIFFUSER DRILL AIR PNEUMATIC (MISCELLANEOUS) ×3 IMPLANT
DRAPE C-ARM 42X72 X-RAY (DRAPES) ×9 IMPLANT
DRAPE HALF SHEET 40X57 (DRAPES) IMPLANT
DRAPE LAPAROTOMY 100X72 PEDS (DRAPES) ×3 IMPLANT
DRAPE MICROSCOPE LEICA (MISCELLANEOUS) IMPLANT
DRAPE POUCH INSTRU U-SHP 10X18 (DRAPES) ×3 IMPLANT
DRAPE SHEET LG 3/4 BI-LAMINATE (DRAPES) IMPLANT
DRILL BIT UCSC CANN STERILE (BIT) ×2
DRSG OPSITE POSTOP 3X4 (GAUZE/BANDAGES/DRESSINGS) ×3 IMPLANT
ELECT COATED BLADE 2.86 ST (ELECTRODE) ×3 IMPLANT
ELECT REM PT RETURN 9FT ADLT (ELECTROSURGICAL) ×3
ELECTRODE REM PT RTRN 9FT ADLT (ELECTROSURGICAL) ×1 IMPLANT
EVACUATOR 1/8 PVC DRAIN (DRAIN) IMPLANT
GAUZE SPONGE 4X4 12PLY STRL (GAUZE/BANDAGES/DRESSINGS) IMPLANT
GAUZE SPONGE 4X4 16PLY XRAY LF (GAUZE/BANDAGES/DRESSINGS) IMPLANT
GLOVE BIO SURGEON STRL SZ7 (GLOVE) IMPLANT
GLOVE BIOGEL PI IND STRL 7.0 (GLOVE) IMPLANT
GLOVE BIOGEL PI IND STRL 7.5 (GLOVE) ×1 IMPLANT
GLOVE BIOGEL PI INDICATOR 7.0 (GLOVE)
GLOVE BIOGEL PI INDICATOR 7.5 (GLOVE) ×2
GOWN STRL REUS W/ TWL LRG LVL3 (GOWN DISPOSABLE) ×1 IMPLANT
GOWN STRL REUS W/ TWL XL LVL3 (GOWN DISPOSABLE) ×1 IMPLANT
GOWN STRL REUS W/TWL LRG LVL3 (GOWN DISPOSABLE) ×2
GOWN STRL REUS W/TWL XL LVL3 (GOWN DISPOSABLE) ×2
GUIDEWIRE THREADED 720MM (WIRE) ×3 IMPLANT
HEMOSTAT POWDER KIT SURGIFOAM (HEMOSTASIS) ×3 IMPLANT
KIT BASIN OR (CUSTOM PROCEDURE TRAY) ×3 IMPLANT
KIT ROOM TURNOVER OR (KITS) ×3 IMPLANT
NEEDLE HYPO 21X1.5 SAFETY (NEEDLE) ×3 IMPLANT
NEEDLE HYPO 25X1 1.5 SAFETY (NEEDLE) ×3 IMPLANT
NEEDLE SPNL 18GX3.5 QUINCKE PK (NEEDLE) ×3 IMPLANT
NS IRRIG 1000ML POUR BTL (IV SOLUTION) ×3 IMPLANT
OIL CARTRIDGE MAESTRO DRILL (MISCELLANEOUS) ×3
PACK LAMINECTOMY NEURO (CUSTOM PROCEDURE TRAY) ×3 IMPLANT
PAD ARMBOARD 7.5X6 YLW CONV (MISCELLANEOUS) ×3 IMPLANT
PATTIES SURGICAL .5X1.5 (GAUZE/BANDAGES/DRESSINGS) ×3 IMPLANT
PIN DISTRACTION 14MM (PIN) IMPLANT
RUBBERBAND STERILE (MISCELLANEOUS) ×6 IMPLANT
SCREW CANN CORT LAG 4.0 42MM (Screw) ×3 IMPLANT
SPONGE INTESTINAL PEANUT (DISPOSABLE) ×3 IMPLANT
SPONGE SURGIFOAM ABS GEL SZ50 (HEMOSTASIS) IMPLANT
STAPLER VISISTAT 35W (STAPLE) ×3 IMPLANT
STOCKINETTE 6  STRL (DRAPES) ×2
STOCKINETTE 6 STRL (DRAPES) ×1 IMPLANT
SUT STRATAFIX MNCRL+ 3-0 PS-2 (SUTURE) ×2
SUT STRATAFIX MONOCRYL 3-0 (SUTURE) ×4
SUT VIC AB 3-0 SH 8-18 (SUTURE) ×3 IMPLANT
SUTURE STRATFX MNCRL+ 3-0 PS-2 (SUTURE) ×2 IMPLANT
SYR 30ML LL (SYRINGE) ×3 IMPLANT
TOWEL GREEN STERILE (TOWEL DISPOSABLE) ×3 IMPLANT
TOWEL GREEN STERILE FF (TOWEL DISPOSABLE) ×6 IMPLANT
WATER STERILE IRR 1000ML POUR (IV SOLUTION) ×3 IMPLANT

## 2017-09-27 NOTE — Op Note (Signed)
09/23/2017 - 09/27/2017  12:11 PM  PATIENT:  Frederick Warren  57 y.o. male  PRE-OPERATIVE DIAGNOSIS:  Odontoid fracture (Type 2) with displacement  POST-OPERATIVE DIAGNOSIS:  Same  PROCEDURE:  Anterior odontoid screw fixation, removal of Gardner-Wells tongs  SURGEON:  Hulan SaasBenjamin J. Briceson Broadwater, MD  ASSISTANTS: Barnett AbuHenry Elsner, MD  ANESTHESIA:   General  DRAINS: None   SPECIMEN:  None  INDICATION FOR PROCEDURE: 57 year old male with a displaced odontoid fracture which was reduced with Gardner-Wells traction. Patient understood the risks, benefits, and alternatives and potential outcomes and wished to proceed.  PROCEDURE DETAILS: Patient was brought to the operating room placed under general endotracheal anesthesia. Weight was taken off and the Gardner-Wells tongs were removed without incident.  Patient was placed in the supine position on the operating room table. The neck was prepped with and chloraprep and draped in a sterile fashion.     Local anesthestic was injected and a transverse incision was made on the right side of the neck.  Dissection was carried down thru the subcutaneous tissue and the platysma was  elevated, opened, and undermined with Metzenbaum scissors.  Dissection was then carried out thru an avascular plane leaving the sternocleidomastoid, carotid artery, and jugular vein laterally and the trachea and esophagus medially.   The anterior inferior aspect of the C2 body was identified.  The drill guide was placed and anchored and position was confirmed with AP and lateral fluoro.  A k-wire was passed under live fluoro across the fracture into the fractured dens.  We then measured the appropriate screw length.  We then drilled over the k-wire into the body of C2 under live fluoro.  The k-wire was stabilized and did not advance.  We then tapped to the fracture fragment, again utilizing live fluoro with care taken to stabilize the k-wire.  Finally the screw was placed over the k-wire and  advanced to the appropriate depth.  The K-wire was removed.  Hemostasis was obtained with topical hemostatic agents.  The wound was irrigated.  The platysma was closed with interrupted vicryl sutures.  The subcuticular layer was closed with a monocryl stratafix suture and the skin was sealed with dermabond.  The patient's Aspen collar was replaced.  PATIENT DISPOSITION:  PACU - hemodynamically stable.   Delay start of Pharmacological VTE agent (>24hrs) due to surgical blood loss or risk of bleeding:  yes

## 2017-09-27 NOTE — Progress Notes (Addendum)
Vinny, [place] with neuro sx paged, no surgical orders in place. Patients mother request  To speak with Dr. Bevely Palmeritty prior to surgery.

## 2017-09-27 NOTE — Progress Notes (Signed)
No acute events Neurologically unchanged Xray showed adequate reduction of fracture for odontoid screw placement Discussed surgery with patient and his mother Will proceed with odontoid screw this morning

## 2017-09-27 NOTE — Anesthesia Procedure Notes (Addendum)
Procedure Name: Awake intubation Date/Time: 09/27/2017 11:05 AM Performed by: Oleta Mouse, MD Pre-anesthesia Checklist: Patient identified, Emergency Drugs available, Suction available, Patient being monitored and Timeout performed Patient Re-evaluated:Patient Re-evaluated prior to induction Oxygen Delivery Method: Circle system utilized Preoxygenation: Pre-oxygenation with 100% oxygen Induction Type: IV induction Grade View: Grade I Tube type: Oral Tube size: 7.0 mm Airway Equipment and Method: Fiberoptic brochoscope and LTA kit utilized Placement Confirmation: positive ETCO2,  breath sounds checked- equal and bilateral and ETT inserted through vocal cords under direct vision Secured at: 22 cm Tube secured with: Tape Comments: Awake intubation request by Ditty, MD. Patient remained in traction. Sedation given and 4% Lidocaine administered via fiberoptic scope by Ermalene Postin, MD. Patient spontaneously ventilating. ETT passed over fiberoptic scope through cords. ETT secured, smooth IV induction, BBS=, +ETCO2.

## 2017-09-27 NOTE — Anesthesia Preprocedure Evaluation (Signed)
Anesthesia Evaluation  Patient identified by MRN, date of birth, ID band Patient awake    Reviewed: Allergy & Precautions, NPO status , Patient's Chart, lab work & pertinent test results  History of Anesthesia Complications Negative for: history of anesthetic complications  Airway Mallampati: II  TM Distance: >3 FB Neck ROM: Limited  Mouth opening: Limited Mouth Opening  Dental  (+) Teeth Intact,    Pulmonary Current Smoker,    breath sounds clear to auscultation       Cardiovascular negative cardio ROS   Rhythm:Regular     Neuro/Psych C2 fracture with left arm weakness  Neuromuscular disease    GI/Hepatic negative GI ROS, (+)     substance abuse  alcohol use and cocaine use,   Endo/Other  negative endocrine ROS  Renal/GU negative Renal ROS     Musculoskeletal   Abdominal   Peds  Hematology negative hematology ROS (+)   Anesthesia Other Findings   Reproductive/Obstetrics                             Anesthesia Physical Anesthesia Plan  ASA: II  Anesthesia Plan: General   Post-op Pain Management:    Induction:   PONV Risk Score and Plan: 1 and Ondansetron  Airway Management Planned: Oral ETT and Fiberoptic Intubation Planned  Additional Equipment: None  Intra-op Plan:   Post-operative Plan: Possible Post-op intubation/ventilation and Extubation in OR  Informed Consent: I have reviewed the patients History and Physical, chart, labs and discussed the procedure including the risks, benefits and alternatives for the proposed anesthesia with the patient or authorized representative who has indicated his/her understanding and acceptance.   Dental advisory given  Plan Discussed with: CRNA and Surgeon  Anesthesia Plan Comments: (Awake fiberoptic intubation planned after discussion with Dr Bevely Palmeritty)        Anesthesia Quick Evaluation

## 2017-09-27 NOTE — Transfer of Care (Signed)
Immediate Anesthesia Transfer of Care Note  Patient: Rolla EtienneBryan Cullinane  Procedure(s) Performed: ODONTOID SCREW (N/A Neck)  Patient Location: PACU  Anesthesia Type:General  Level of Consciousness: awake, alert  and patient cooperative  Airway & Oxygen Therapy: Patient Spontanous Breathing  Post-op Assessment: Report given to RN and Post -op Vital signs reviewed and stable  Post vital signs: Reviewed and stable  Last Vitals:  Vitals:   09/27/17 0900 09/27/17 0952  BP:    Pulse: (!) 103   Resp:    Temp:  36.9 C  SpO2:  95%    Last Pain:  Vitals:   09/27/17 0952  TempSrc: Oral  PainSc:       Patients Stated Pain Goal: 5 (09/27/17 0208)  Complications: No apparent anesthesia complications

## 2017-09-27 NOTE — Progress Notes (Signed)
PT Cancellation Note  Patient Details Name: Frederick EtienneBryan Piascik MRN: [id number] DOB: 03-03-1961   Cancelled Treatment:    Reason Eval/Treat Not Completed: Patient not medically ready   Noted plans for surgical fixation of C2 fracture tomorrow;   Will follow up post-op for PT eval; Please re-order postop with any precautions as well;   Thank you,  Van ClinesHolly Gurneet Matarese, PT  Acute Rehabilitation Services Pager [phone] Office [phone]    Levi AlandHolly H Kaytelynn Scripter 09/27/2017, 8:09 AM
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
## 2017-09-28 ENCOUNTER — Encounter (HOSPITAL_COMMUNITY): Payer: Self-pay | Admitting: Neurological Surgery

## 2017-09-28 ENCOUNTER — Inpatient Hospital Stay (HOSPITAL_COMMUNITY): Payer: No Typology Code available for payment source

## 2017-09-28 DIAGNOSIS — G8192 Hemiplegia, unspecified affecting left dominant side: Secondary | ICD-10-CM

## 2017-09-28 DIAGNOSIS — M79609 Pain in unspecified limb: Secondary | ICD-10-CM

## 2017-09-28 DIAGNOSIS — S14109A Unspecified injury at unspecified level of cervical spinal cord, initial encounter: Secondary | ICD-10-CM

## 2017-09-28 DIAGNOSIS — S12100A Unspecified displaced fracture of second cervical vertebra, initial encounter for closed fracture: Secondary | ICD-10-CM

## 2017-09-28 DIAGNOSIS — M7989 Other specified soft tissue disorders: Secondary | ICD-10-CM

## 2017-09-28 MED ORDER — MAGNESIUM CITRATE PO SOLN: 1.0000 | Freq: Once | ORAL | Status: DC

## 2017-09-28 MED ORDER — NICOTINE 7 MG/24HR TD PT24
7.0000 mg | MEDICATED_PATCH | Freq: Every day | TRANSDERMAL | Status: DC
  Administered 2017-09-28 – 2017-09-30 (×3): 7 mg via TRANSDERMAL
  Filled 2017-09-28 (×3): qty 1

## 2017-09-28 MED ORDER — ENOXAPARIN SODIUM 40 MG/0.4ML ~~LOC~~ SOLN
40.0000 mg | SUBCUTANEOUS | Status: DC
  Administered 2017-09-28 – 2017-09-29 (×2): 40 mg via SUBCUTANEOUS
  Filled 2017-09-28 (×2): qty 0.4

## 2017-09-28 MED ORDER — MAGNESIUM CITRATE PO SOLN
1.0000 | Freq: Once | ORAL | Status: AC
  Administered 2017-09-28: 1 via ORAL
  Filled 2017-09-28: qty 296
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
## 2017-09-28 NOTE — Progress Notes (Signed)
No acute events Neurologically stable Incision looks good Ok for discharge from my perspective Follow up in 3 weeks

## 2017-09-28 NOTE — Progress Notes (Signed)
Received call in regards to patient's LUE weakness and ?difficulties sustaining left gaze per OT Went to examine patient. LUE strength is stable. CN grossly intact. Nursing reports he has been stable all day. No acute changes. Will rule out cva with head CT due to gaze reports by therapy.  Of note, patient is adamant he goes home today. He does not want inpatient rehab. Would like outpt therapy. I have discussed this with him, but unfortunately, cannot convince him.

## 2017-09-28 NOTE — Progress Notes (Signed)
PT called and stated pt had swelling in LUE. Pt complaining of pain in left arm and particularly in the antecubital region where his IV is. Will order a DVT study to rule out DVT since pt has not been moving that arm much. He has good elbow flexion and extension and okay grip strength but weakness in shoulder movements. Sensation intact. Suspect possible rotator cuff injury.   Asked Charma IgoMichael Jeffery, PA with ortho to review R wrist films and he stated the wrist looks okay. Nothing to do.  Mattie MarlinJessica Asha Grumbine, [facility] Pager [phone]

## 2017-09-28 NOTE — Progress Notes (Signed)
Occupational Therapy Note  OT evaluation completed.  Pt demonstrates Lt UE weakness - movement appears to be synergistic in pattern.   He demonstrates significant cognitive awareness including poor safety awareness, sustained - selective attention, poor awareness of deficits and impaired problem solving.  He has difficulty sustaining Gaze to the Lt, and nystagmus noted with horizontal and superior gaze to the Lt.  He endorses dizziness with sit> supine.  He requires min A for static standing and mod A for dynamic standing due to impaired balance, and delayed balance responses.  Overall, he requires mod A for LB ADL.  He is at high risk for falls and injury.  Recommend CIR.  Full eval to follow.   Jeani HawkingWendi Elga Santy, OTR/L [phone]
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
## 2017-09-28 NOTE — Progress Notes (Signed)
Inpatient Rehabilitation  Per PT request, patient was screened by Fae PippinMelissa Traeh Milroy for appropriateness for an Inpatient Acute Rehab consult.  At this time note that a consult order has been placed.  Plan for an Admission's Coordinator to follow up after completion of the consult.    Charlane FerrettiMelissa Piper Hassebrock, M.A., CCC/SLP Admission Coordinator  [facility]  Cell [phone]

## 2017-09-28 NOTE — Progress Notes (Addendum)
I received a call from OT who stated she is concerned pt's left arm weakness is more likely neuro in etiology than musculoskeletal. I ordered a head CT without contrast and informed neurosurgery.   Mattie MarlinJessica Affie Gasner, [facility] Pager [phone]  Addendum: Kathryne ErikssonVinny Costella, PA stated he would come by to evaluate the patient. Appreciate his help.

## 2017-09-28 NOTE — Progress Notes (Signed)
*  PRELIMINARY RESULTS* Vascular Ultrasound Left upper extremity venous duplex has been completed.  Preliminary findings: The visualized veins of the left upper extremity appear negative for deep and superficial vein thrombosis.  Chauncey FischerCharlotte C Waynetta Metheny 09/28/2017, 2:12 PM
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
## 2017-09-28 NOTE — Anesthesia Postprocedure Evaluation (Signed)
Anesthesia Post Note  Patient: Frederick Warren  Procedure(s) Performed: ODONTOID SCREW (N/A Neck)     Patient location during evaluation: PACU Anesthesia Type: General Level of consciousness: awake and alert Pain management: pain level controlled Vital Signs Assessment: post-procedure vital signs reviewed and stable Respiratory status: spontaneous breathing, nonlabored ventilation, respiratory function stable and patient connected to nasal cannula oxygen Cardiovascular status: blood pressure returned to baseline and stable Postop Assessment: no apparent nausea or vomiting Anesthetic complications: no    Last Vitals:  Vitals:   09/28/17 0400 09/28/17 0720  BP: 122/76   Pulse: 90   Resp: (!) 8   Temp:  36.7 C  SpO2: 96%     Last Pain:  Vitals:   09/28/17 0329  TempSrc:   PainSc: 7                  Yenifer Saccente

## 2017-09-29 ENCOUNTER — Inpatient Hospital Stay (HOSPITAL_COMMUNITY): Payer: No Typology Code available for payment source

## 2017-09-29 LAB — CBC
HEMATOCRIT: 40.2 % (ref 39.0–52.0)
HEMOGLOBIN: 13.2 g/dL (ref 13.0–17.0)
MCH: 29.7 pg (ref 26.0–34.0)
MCHC: 32.8 g/dL (ref 30.0–36.0)
MCV: 90.3 fL (ref 78.0–100.0)
Platelets: 299 10*3/uL (ref 150–400)
RBC: 4.45 MIL/uL (ref 4.22–5.81)
RDW: 13.2 % (ref 11.5–15.5)
WBC: 14.4 10*3/uL — ABNORMAL HIGH (ref 4.0–10.5)

## 2017-09-29 LAB — BASIC METABOLIC PANEL
ANION GAP: 11 (ref 5–15)
BUN: 23 mg/dL — ABNORMAL HIGH (ref 6–20)
CO2: 23 mmol/L (ref 22–32)
Calcium: 8.7 mg/dL — ABNORMAL LOW (ref 8.9–10.3)
Chloride: 102 mmol/L (ref 101–111)
Creatinine, Ser: 0.87 mg/dL (ref 0.61–1.24)
GFR calc Af Amer: >60 mL/min (ref >60)
GLUCOSE: 142 mg/dL — AB (ref 65–99)
POTASSIUM: 3.6 mmol/L (ref 3.5–5.1)
Sodium: 136 mmol/L (ref 135–145)

## 2017-09-29 MED ORDER — IOPAMIDOL (ISOVUE-370) INJECTION 76%
INTRAVENOUS | Status: AC
  Administered 2017-09-29: 50 mL
  Filled 2017-09-29: qty 50

## 2017-09-29 MED ORDER — PANTOPRAZOLE SODIUM 40 MG PO TBEC
40.0000 mg | DELAYED_RELEASE_TABLET | Freq: Every day | ORAL | Status: DC
  Administered 2017-09-29: 40 mg via ORAL
  Filled 2017-09-29: qty 1

## 2017-09-29 NOTE — Progress Notes (Signed)
Rehab admissions - I met with patient and his mother at the bedside.  Patient does not want to come to CIR because he has no funding.  He says he wants to go home today.  I gave him and his mother a rehab letter and rehab booklets.  I will let patient and mom talk about rehab options.  If patient remains in the hospital, I will see him again in am.  I suspect he will choose to discharge directly home due to no funding.  Call me for questions.  #[phone]
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
## 2017-09-30 MED ORDER — METHOCARBAMOL 750 MG PO TABS: 750.0000 mg | ORAL_TABLET | Freq: Three times a day (TID) | ORAL | 0 refills | Status: DC | PRN

## 2017-09-30 MED ORDER — MECLIZINE HCL 25 MG PO TABS
25.0000 mg | ORAL_TABLET | Freq: Three times a day (TID) | ORAL | Status: DC
  Administered 2017-09-30: 25 mg via ORAL
  Filled 2017-09-30: qty 1

## 2017-09-30 MED ORDER — MECLIZINE HCL 25 MG PO TABS: 25.0000 mg | ORAL_TABLET | Freq: Three times a day (TID) | ORAL | 0 refills | Status: DC

## 2017-09-30 MED ORDER — CELECOXIB 200 MG PO CAPS: 200.0000 mg | ORAL_CAPSULE | Freq: Two times a day (BID) | ORAL | 0 refills | Status: DC

## 2017-09-30 MED ORDER — GABAPENTIN 300 MG PO CAPS: 300.0000 mg | ORAL_CAPSULE | Freq: Three times a day (TID) | ORAL | 0 refills | Status: DC

## 2017-09-30 MED ORDER — OXYCODONE HCL 5 MG PO TABS: 5.0000 mg | ORAL_TABLET | ORAL | 0 refills | Status: DC | PRN
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
## 2017-09-30 NOTE — Plan of Care (Signed)
Pt is being discharged home with his mother.
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
## 2017-09-30 NOTE — Progress Notes (Signed)
Orthopedic Tech Progress Note Patient Details:  Rolla EtienneBryan Lacosse 02-26-1961 [id number]  Ortho Devices Type of Ortho Device: Arm sling Ortho Device/Splint Location: lue Ortho Device/Splint Interventions: Application   Post Interventions Patient Tolerated: Well Instructions Provided: Care of device   Nikki DomCrawford, Renee Beale 09/30/2017, 8:43 AM

## 2017-09-30 NOTE — Progress Notes (Signed)
NURSING PROGRESS NOTE  Frederick Warren Dagostino [id number] Discharge Data: 09/30/2017 1:39 PM Attending Provider: Md, Trauma, MD QMV:HQIONGEPCP:Patient, No Pcp Per     Frederick Warren Howell to be D/C'd Home per MD order.  Discussed with the patient the After Visit Summary and all questions fully answered. All IV's discontinued with no bleeding noted. All belongings returned to patient for patient to take home. Pt sent with printed prescriptions celebrex, neurontin, antivert, robaxin and oxycodone  Last Vital Signs:  Blood pressure 139/84, pulse 82, temperature 98.1 F (36.7 C), temperature source Oral, resp. rate 16, height 5\' 7"  (1.702 m), weight 63.7 kg (140 lb 6.9 oz), SpO2 96 %.  Discharge Medication List Allergies as of 09/30/2017      Reactions   Other Hives, Rash   "All seafood"   Shellfish-derived Products Hives, Rash      Medication List    TAKE these medications   celecoxib 200 MG capsule Commonly known as:  CELEBREX Take 1 capsule (200 mg total) by mouth every 12 (twelve) hours.   gabapentin 300 MG capsule Commonly known as:  NEURONTIN Take 1 capsule (300 mg total) by mouth 3 (three) times daily.   meclizine 25 MG tablet Commonly known as:  ANTIVERT Take 1 tablet (25 mg total) by mouth 3 (three) times daily.   methocarbamol 750 MG tablet Commonly known as:  ROBAXIN Take 1 tablet (750 mg total) by mouth every 8 (eight) hours as needed for muscle spasms.   oxyCODONE 5 MG immediate release tablet Commonly known as:  Oxy IR/ROXICODONE Take 1 tablet (5 mg total) by mouth every 3 (three) hours as needed for moderate pain ((score 4 to 6)).            Durable Medical Equipment  (From admission, onward)        Start     Ordered   09/30/17 1322  For home use only DME Tub bench  Once     09/30/17 1321

## 2017-09-30 NOTE — Progress Notes (Signed)
Rehab admissions - Patient is now doing well and therapy [facility].  Will not need an inpatient rehab stay.  Call me for questions.  #[phone]

## 2017-09-30 NOTE — Discharge Instructions (Signed)
LEAVE C COLLAR ON AT ALL TIMES FOR 3 MONTHS EXCEPT WHEN SHOWERING REMOVE DRESSING FROM NECK ON Friday AND REPLACE WITH A CLEAN DRY BANDAGE BE SURE TO FOLLOW UP WITH DR. DITTY WITHIN 3 WEEKS  Please follow up with your primary care provider regarding your vertigo and left shoulder pain. Take the Antivert for 2 weeks or until your follow up with your primary care provider.   1. PAIN CONTROL:  1. Pain is best controlled by a usual combination of three different methods TOGETHER:  1. Ice/Heat 2. Over the counter pain medication 3. Prescription pain medication 2. Most patients will experience some swelling and bruising around wounds. Ice packs or heating pads (30-60 minutes up to 6 times a day) will help. Use ice for the first few days to help decrease swelling and bruising, then switch to heat to help relax tight/sore spots and speed recovery. Some people prefer to use ice alone, heat alone, alternating between ice & heat. Experiment to what works for you. Swelling and bruising can take several weeks to resolve.  3. It is helpful to take an over-the-counter pain medication regularly for the first few weeks. Choose one of the following that works best for you:  1. Naproxen (Aleve, etc) Two 220mg  tabs twice a day 2. Ibuprofen (Advil, etc) Three 200mg  tabs four times a day (every meal & bedtime) 3. Acetaminophen (Tylenol, etc) 500-650mg  four times a day (every meal & bedtime) 4. A prescription for pain medication (such as oxycodone, hydrocodone, etc) should be given to you upon discharge. Take your pain medication as prescribed.  1. If you are having problems/concerns with the prescription medicine (does not control pain, nausea, vomiting, rash, itching, etc), please call us [phone] to see if we need to switch you to a different pain medicine that will work better for you and/or control your side effect better. 2. If you need a refill on your pain medication, please contact your pharmacy. They will  contact our office to request authorization. Prescriptions will not be filled after 5 pm or on week-ends. 4. Avoid getting constipated. When taking pain medications, it is common to experience some constipation. Increasing fluid intake and taking a fiber supplement (such as Metamucil, Citrucel, FiberCon, MiraLax, etc) 1-2 times a day regularly will usually help prevent this problem from occurring. A mild laxative (prune juice, Milk of Magnesia, MiraLax, etc) should be taken according to package directions if there are no bowel movements after 48 hours.  5. Watch out for diarrhea. If you have many loose bowel movements, simplify your diet to bland foods & liquids for a few days. Stop any stool softeners and decrease your fiber supplement. Switching to mild anti-diarrheal medications (Kayopectate, Pepto Bismol) can help. If this worsens or does not improve, please call us. 6. Wash / shower every day. You may shower daily and replace your bandges after showering. No bathing or submerging your wounds in water until they heal. 7. FOLLOW UP in our office  Please call [facility] at [phone] if you have any questions or concerns  WHEN TO CALL US [phone]:  1. Poor pain control 2. Reactions / problems with new medications (rash/itching, nausea, etc)  3. Fever over 101.5 F (38.5 C) 4. Worsening swelling or bruising 5. Continued bleeding from wounds. 6. Increased pain, redness, or drainage from the wounds which could be signs of infection 7. New or worsening weakness of arms or legs 8. Tingling   The clinic staff is available to  answer your questions during regular business hours (8:30am-5pm). Please dont hesitate to call and ask to speak to one of our nurses for clinical concerns.  If you have a medical emergency, go to the nearest emergency room or call 911.  A surgeon from [facility] is always on call at the [facility], [place]  [address], [place], [place] [address] ?  MAIN: [phone] ? TOLL FREE: [phone] ?  FAX [phone]  [email or web address]      Vertigo Vertigo is the feeling that you or your surroundings are moving when they are not. Vertigo can be dangerous if it occurs while you are doing something that could endanger you or others, such as driving. What are the causes? This condition is caused by a disturbance in the signals that are sent by your bodys sensory systems to your brain. Different causes of a disturbance can lead to vertigo, including:  Infections, especially in the inner ear.  A bad reaction to a drug, or misuse of alcohol and medicines.  Withdrawal from drugs or alcohol.  Quickly changing positions, as when lying down or rolling over in bed.  Migraine headaches.  Decreased blood flow to the brain.  Decreased blood pressure.  Increased pressure in the brain from a head or neck injury, stroke, infection, tumor, or bleeding.  Central nervous system disorders.  What are the signs or symptoms? Symptoms of this condition usually occur when you move your head or your eyes in different directions. Symptoms may start suddenly, and they usually last for less than a minute. Symptoms may include:  Loss of balance and falling.  Feeling like you are spinning or moving.  Feeling like your surroundings are spinning or moving.  Nausea and vomiting.  Blurred vision or double vision.  Difficulty hearing.  Slurred speech.  Dizziness.  Involuntary eye movement (nystagmus).  Symptoms can be mild and cause only slight annoyance, or they can be severe and interfere with daily life. Episodes of vertigo may return (recur) over time, and they are often triggered by certain movements. Symptoms may improve over time. How is this diagnosed? This condition may be diagnosed based on medical history and the quality of your nystagmus. Your health care provider may test your eye  movements by asking you to quickly change positions to trigger the nystagmus. This may be called the Dix-Hallpike test, head thrust test, or roll test. You may be referred to a health care provider who specializes in ear, nose, and throat (ENT) problems (otolaryngologist) or a provider who specializes in disorders of the central nervous system (neurologist). You may have additional testing, including:  A physical exam.  Blood tests.  MRI.  A CT scan.  An electrocardiogram (ECG). This records electrical activity in your heart.  An electroencephalogram (EEG). This records electrical activity in your brain.  Hearing tests.  How is this treated? Treatment for this condition depends on the cause and the severity of the symptoms. Treatment options include:  Medicines to treat nausea or vertigo. These are usually used for severe cases. Some medicines that are used to treat other conditions may also reduce or eliminate vertigo symptoms. These include: ? Medicines that control allergies (antihistamines). ? Medicines that control seizures (anticonvulsants). ? Medicines that relieve depression (antidepressants). ? Medicines that relieve anxiety (sedatives).  Head movements to adjust your inner ear back to normal. If your vertigo is caused by an ear problem, your health care provider may recommend  certain movements to correct the problem.  Surgery. This is rare.  Follow these instructions at home: Safety  Move slowly.Avoid sudden body or head movements.  Avoid driving.  Avoid operating heavy machinery.  Avoid doing any tasks that would cause danger to you or others if you would have a vertigo episode during the task.  If you have trouble walking or keeping your balance, try using a cane for stability. If you feel dizzy or unstable, sit down right away.  Return to your normal activities as told by your health care provider. Ask your health care provider what activities are safe for  you. General instructions  Take over-the-counter and prescription medicines only as told by your health care provider.  Avoid certain positions or movements as told by your health care provider.  Drink enough fluid to keep your urine clear or pale yellow.  Keep all follow-up visits as told by your health care provider. This is important. Contact a health care provider if:  Your medicines do not relieve your vertigo or they make it worse.  You have a fever.  Your condition gets worse or you develop new symptoms.  Your family or friends notice any behavioral changes.  Your nausea or vomiting gets worse.  You have numbness or a pins and needles sensation in part of your body. Get help right away if:  You have difficulty moving or speaking.  You are always dizzy.  You faint.  You develop severe headaches.  You have weakness in your hands, arms, or legs.  You have changes in your hearing or vision.  You develop a stiff neck.  You develop sensitivity to light. This information is not intended to replace advice given to you by your health care provider. Make sure you discuss any questions you have with your health care provider. Document Released: 06/03/2005 Document Revised: 02/05/2016 Document Reviewed: 12/17/2014 Elsevier Interactive Patient Education  [employer].
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
## 2017-10-01 ENCOUNTER — Telehealth (HOSPITAL_COMMUNITY): Payer: Self-pay | Admitting: Emergency Medicine
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
## 2020-02-13 ENCOUNTER — Ambulatory Visit (INDEPENDENT_AMBULATORY_CARE_PROVIDER_SITE_OTHER): Payer: Medicaid Other | Admitting: Family Medicine

## 2020-02-13 ENCOUNTER — Other Ambulatory Visit: Payer: Self-pay

## 2020-02-13 ENCOUNTER — Encounter: Payer: Self-pay | Admitting: Family Medicine

## 2020-02-13 ENCOUNTER — Other Ambulatory Visit: Payer: Self-pay | Admitting: Family Medicine

## 2020-02-13 VITALS — BP 110/73 | HR 73 | Resp 16 | Ht 67.0 in | Wt 139.6 lb

## 2020-02-13 DIAGNOSIS — Z1211 Encounter for screening for malignant neoplasm of colon: Secondary | ICD-10-CM

## 2020-02-13 DIAGNOSIS — Z7689 Persons encountering health services in other specified circumstances: Secondary | ICD-10-CM

## 2020-02-13 DIAGNOSIS — M542 Cervicalgia: Secondary | ICD-10-CM

## 2020-02-13 DIAGNOSIS — Z125 Encounter for screening for malignant neoplasm of prostate: Secondary | ICD-10-CM

## 2020-02-13 DIAGNOSIS — M153 Secondary multiple arthritis: Secondary | ICD-10-CM | POA: Diagnosis not present

## 2020-02-13 DIAGNOSIS — M545 Low back pain, unspecified: Secondary | ICD-10-CM | POA: Insufficient documentation

## 2020-02-13 DIAGNOSIS — G8929 Other chronic pain: Secondary | ICD-10-CM

## 2020-02-13 DIAGNOSIS — Z0000 Encounter for general adult medical examination without abnormal findings: Secondary | ICD-10-CM

## 2020-02-13 DIAGNOSIS — Z13220 Encounter for screening for lipoid disorders: Secondary | ICD-10-CM

## 2020-02-13 DIAGNOSIS — R7309 Other abnormal glucose: Secondary | ICD-10-CM

## 2020-02-13 NOTE — Patient Instructions (Addendum)
Thank you for coming to the office today.   Colon Cancer Screening: - For all adults age 61+ routine colon cancer screening is highly recommended.     - Recent guidelines from [place] recommend starting age of 45 - Early detection of colon cancer is important, because often there are no warning signs or symptoms, also if found early usually it can be cured. Late stage is hard to treat.  - If you are not interested in Colonoscopy screening (if done and normal you could be cleared for 5 to 10 years until next due), then Cologuard is an excellent alternative for screening test for Colon Cancer. It is highly sensitive for detecting DNA of colon cancer from even the earliest stages. Also, there is NO bowel prep required. - If Cologuard is NEGATIVE, then it is good for 3 years before next due - If Cologuard is POSITIVE, then it is strongly advised to get a Colonoscopy, which allows the GI doctor to locate the source of the cancer or polyp (even very early stage) and treat it by removing it. ------------------------- COLOGUARD HOME KIT ordered today Follow instructions to collect sample, you may call the company for any help or questions, 24/7 telephone support at [phone].  DUE for FASTING BLOOD WORK (no food or drink after midnight before the lab appointment, only water or coffee without cream/sugar on the morning of)  SCHEDULE "Lab Only" visit in the morning at the clinic for lab draw in 4 WEEKS   - Make sure Lab Only appointment is at about 1 week before your next appointment, so that results will be available  For Lab Results, once available within 2-3 days of blood draw, you can can log in to MyChart online to view your results and a brief explanation. Also, we can discuss results at next follow-up visit.    Please schedule a Follow-up Appointment to: Return in about 4 weeks (around 03/12/2020) for Annual Physical.  If you have any other questions or concerns, please  feel free to call the office or send a message through [facility]. You may also schedule an earlier appointment if necessary.  Additionally, you may be receiving a survey about your experience at our office within a few days to 1 week by e-mail or mail. We value your feedback.  Nobie Putnam, DO [place]
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
## 2020-03-07 ENCOUNTER — Other Ambulatory Visit: Payer: Medicaid Other

## 2020-03-12 ENCOUNTER — Other Ambulatory Visit: Payer: Medicaid Other

## 2020-03-14 ENCOUNTER — Encounter: Payer: Medicaid Other | Admitting: Family Medicine

## 2020-08-05 ENCOUNTER — Ambulatory Visit: Payer: Medicaid Other | Admitting: Family Medicine

## 2020-08-05 ENCOUNTER — Encounter: Payer: Self-pay | Admitting: Family Medicine

## 2020-08-05 ENCOUNTER — Other Ambulatory Visit: Payer: Self-pay

## 2020-08-05 DIAGNOSIS — Z202 Contact with and (suspected) exposure to infections with a predominantly sexual mode of transmission: Secondary | ICD-10-CM

## 2020-08-05 DIAGNOSIS — Z113 Encounter for screening for infections with a predominantly sexual mode of transmission: Secondary | ICD-10-CM | POA: Diagnosis not present

## 2020-08-05 DIAGNOSIS — N5089 Other specified disorders of the male genital organs: Secondary | ICD-10-CM

## 2020-08-05 DIAGNOSIS — F1410 Cocaine abuse, uncomplicated: Secondary | ICD-10-CM

## 2020-08-05 LAB — GRAM STAIN

## 2020-08-05 MED ORDER — METRONIDAZOLE 500 MG PO TABS: 500.0000 mg | ORAL_TABLET | Freq: Two times a day (BID) | ORAL | 0 refills | Status: AC

## 2020-08-05 NOTE — Progress Notes (Signed)
[facility] Department STI clinic/screening visit  Subjective:  Frederick Warren is a 59 y.o. male being seen today for an STI screening visit. The patient reports they do not have symptoms.    Patient has the following medical conditions:   Patient Active Problem List   Diagnosis Date Noted  . Swelling of left half of scrotum 08/05/2020  . Chronic neck pain 02/13/2020  . Chronic bilateral low back pain without sciatica 02/13/2020  . Post-traumatic osteoarthritis of multiple joints 02/13/2020  . History of cervical spinal surgery 09/23/2017  . History of cervical spine trauma 09/23/2017     Chief Complaint  Patient presents with  . Exposure to STD    HPI  Patient reports girlfriend has itching and tested positive for trich Reports using Cocaine "a couple days ago"  Last HIV test was negative but was "years ago in prison"   See flowsheet for further details and programmatic requirements.    The following portions of the patient's history were reviewed and updated as appropriate: allergies, current medications, past medical history, past social history, past surgical history and problem list.  Objective:  There were no vitals filed for this visit.  Physical Exam Constitutional:      Appearance: Normal appearance.  HENT:     Head: Normocephalic and atraumatic.     Comments: No nits or hair loss    Mouth/Throat:     Mouth: Mucous membranes are moist.     Pharynx: Oropharynx is clear. No oropharyngeal exudate or posterior oropharyngeal erythema.  Pulmonary:     Effort: Pulmonary effort is normal.  Abdominal:     General: Abdomen is flat.     Palpations: Abdomen is soft. There is no hepatomegaly or mass.     Tenderness: There is no abdominal tenderness.  Genitourinary:    Pubic Area: No rash or pubic lice.      Penis: Normal and circumcised. No erythema or lesions.      Testes: Normal.        Right: Mass not present.        Left: Mass not present.      Epididymis:     Right: Normal.     Left: Normal.     Tanner stage (genital): 5.     Rectum: Normal.    Lymphadenopathy:     Head:     Right side of head: No preauricular or posterior auricular adenopathy.     Left side of head: No preauricular or posterior auricular adenopathy.     Cervical: No cervical adenopathy.     Upper Body:     Right upper body: No supraclavicular or axillary adenopathy.     Left upper body: No supraclavicular or axillary adenopathy.     Lower Body: No right inguinal adenopathy. No left inguinal adenopathy.  Skin:    General: Skin is warm and dry.     Findings: No rash.  Neurological:     Mental Status: He is alert and oriented to person, place, and time.       Assessment and Plan:  Frederick Warren is a 59 y.o. male presenting to the [facility] Department for STI screening  1. Screening examination for venereal disease Recommended condom use Treat gram stain per standing Treat as contact to trich per standing - Gram stain - HIV/HCV [place] - Syphilis Serology, [facility] - Hepatitis Serology,  Lab - Gonococcus culture  2. Cocaine abuse (HCC)  3. Swelling of  left half of scrotum Discussed with patient  4. Contact to trich - treat per standing trich Recommend PCP follow up   Return if symptoms worsen or fail to improve.  No future appointments.  Federico Flake, MD

## 2020-08-05 NOTE — Progress Notes (Signed)
Post:  RN reviewed gram stain with patient. Gram stain negative. Patient tx for trich as a contact.   Harvie Heck, RN

## 2020-08-09 LAB — HM HIV SCREENING LAB: HM HIV Screening: NEGATIVE

## 2020-08-09 LAB — HEPATITIS B SURFACE ANTIGEN: Hepatitis B Surface Ag: NEGATIVE

## 2020-08-09 LAB — HM HEPATITIS C SCREENING LAB: HM Hepatitis Screen: NEGATIVE

## 2020-08-10 LAB — GONOCOCCUS CULTURE

## 2021-09-15 ENCOUNTER — Encounter: Payer: Medicaid Other | Admitting: Family Medicine

## 2021-11-17 ENCOUNTER — Emergency Department
Admission: EM | Admit: 2021-11-17 | Discharge: 2021-11-17 | Disposition: A | Discharge status: Home / Self Care | Payer: Medicaid Other | Attending: Emergency Medicine | Admitting: Emergency Medicine

## 2021-11-17 ENCOUNTER — Other Ambulatory Visit: Payer: Self-pay

## 2021-11-17 ENCOUNTER — Emergency Department: Payer: Medicaid Other

## 2021-11-17 ENCOUNTER — Encounter: Payer: Self-pay | Admitting: Emergency Medicine

## 2021-11-17 DIAGNOSIS — D72829 Elevated white blood cell count, unspecified: Secondary | ICD-10-CM | POA: Insufficient documentation

## 2021-11-17 DIAGNOSIS — N289 Disorder of kidney and ureter, unspecified: Secondary | ICD-10-CM | POA: Insufficient documentation

## 2021-11-17 DIAGNOSIS — T40711A Poisoning by cannabis, accidental (unintentional), initial encounter: Secondary | ICD-10-CM | POA: Insufficient documentation

## 2021-11-17 DIAGNOSIS — R4182 Altered mental status, unspecified: Secondary | ICD-10-CM | POA: Insufficient documentation

## 2021-11-17 DIAGNOSIS — F1410 Cocaine abuse, uncomplicated: Secondary | ICD-10-CM | POA: Diagnosis not present

## 2021-11-17 DIAGNOSIS — T405X1A Poisoning by cocaine, accidental (unintentional), initial encounter: Secondary | ICD-10-CM | POA: Diagnosis not present

## 2021-11-17 DIAGNOSIS — F1210 Cannabis abuse, uncomplicated: Secondary | ICD-10-CM | POA: Insufficient documentation

## 2021-11-17 DIAGNOSIS — F1910 Other psychoactive substance abuse, uncomplicated: Secondary | ICD-10-CM

## 2021-11-17 LAB — URINALYSIS, COMPLETE (UACMP) WITH MICROSCOPIC
Bilirubin Urine: NEGATIVE
Glucose, UA: NEGATIVE mg/dL
Ketones, ur: NEGATIVE mg/dL
Leukocytes,Ua: NEGATIVE
Nitrite: NEGATIVE
Protein, ur: 30 mg/dL — AB
Specific Gravity, Urine: 1.013 (ref 1.005–1.030)
pH: 6 (ref 5.0–8.0)

## 2021-11-17 LAB — URINE DRUG SCREEN, QUALITATIVE (ARMC ONLY)
Amphetamines, Ur Screen: NOT DETECTED
Barbiturates, Ur Screen: NOT DETECTED
Benzodiazepine, Ur Scrn: NOT DETECTED
Cannabinoid 50 Ng, Ur ~~LOC~~: POSITIVE — AB
Cocaine Metabolite,Ur ~~LOC~~: POSITIVE — AB
MDMA (Ecstasy)Ur Screen: NOT DETECTED
Methadone Scn, Ur: NOT DETECTED
Opiate, Ur Screen: NOT DETECTED
Phencyclidine (PCP) Ur S: NOT DETECTED
Tricyclic, Ur Screen: NOT DETECTED

## 2021-11-17 LAB — CBC
HCT: 52.4 % — ABNORMAL HIGH (ref 39.0–52.0)
Hemoglobin: 16.5 g/dL (ref 13.0–17.0)
MCH: 28.9 pg (ref 26.0–34.0)
MCHC: 31.5 g/dL (ref 30.0–36.0)
MCV: 91.9 fL (ref 80.0–100.0)
Platelets: 364 10*3/uL (ref 150–400)
RBC: 5.7 MIL/uL (ref 4.22–5.81)
RDW: 14.1 % (ref 11.5–15.5)
WBC: 14.1 10*3/uL — ABNORMAL HIGH (ref 4.0–10.5)
nRBC: 0 % (ref 0.0–0.2)

## 2021-11-17 LAB — COMPREHENSIVE METABOLIC PANEL
ALT: 17 U/L (ref 0–44)
AST: 19 U/L (ref 15–41)
Albumin: 4.4 g/dL (ref 3.5–5.0)
Alkaline Phosphatase: 70 U/L (ref 38–126)
Anion gap: 7 (ref 5–15)
BUN: 27 mg/dL — ABNORMAL HIGH (ref 8–23)
CO2: 27 mmol/L (ref 22–32)
Calcium: 9.1 mg/dL (ref 8.9–10.3)
Chloride: 107 mmol/L (ref 98–111)
Creatinine, Ser: 1.51 mg/dL — ABNORMAL HIGH (ref 0.61–1.24)
GFR, Estimated: 52 mL/min — ABNORMAL LOW (ref >60)
Glucose, Bld: 121 mg/dL — ABNORMAL HIGH (ref 70–99)
Potassium: 3.8 mmol/L (ref 3.5–5.1)
Sodium: 141 mmol/L (ref 135–145)
Total Bilirubin: 0.6 mg/dL (ref 0.3–1.2)
Total Protein: 7.9 g/dL (ref 6.5–8.1)

## 2021-11-17 LAB — ETHANOL: Alcohol, Ethyl (B): <10 mg/dL (ref <10)

## 2021-11-17 MED ORDER — SODIUM CHLORIDE 0.9 % IV BOLUS
1000.0000 mL | Freq: Once | INTRAVENOUS | Status: AC
  Administered 2021-11-17: 1000 mL via INTRAVENOUS

## 2021-11-17 MED ORDER — NALOXONE HCL 2 MG/2ML IJ SOSY
1.0000 mg | PREFILLED_SYRINGE | Freq: Once | INTRAMUSCULAR | Status: AC
Start: 2021-11-17 — End: 2021-11-17
  Administered 2021-11-17: 1 mg via INTRAVENOUS
  Filled 2021-11-17: qty 2

## 2021-11-17 NOTE — ED Notes (Signed)
Pt oob to br without assist °

## 2021-11-17 NOTE — ED Provider Notes (Signed)
? ?[facility] ?Provider Note ? ? ? Event Date/Time  ? First MD Initiated Contact with Patient 11/17/21 [phone]   ?  (approximate) ? ?History  ? ?Chief Complaint: Drug Overdose ? ?HPI ? ?Frederick Warren is a 61 y.o. male with a past medical history of substance abuse who presents to the emergency department for a possible overdose.  According EMS patient was last seen around 9 PM last night and was found by his mother this morning unresponsive.  Fire department arrived first found his pupils to be pinpoint gave 2 mg of Narcan.  On arrival patient is somnolent but will awaken to voice and briefly answer questions before falling back asleep.  Patient unwilling to answer most questions including any substance use last night. ? ?Physical Exam  ? ?Triage Vital Signs: ?ED Triage Vitals [11/17/21 0945]  ?Enc Vitals Group  ?   BP 112/77  ?   Pulse Rate 88  ?   Resp (!) 21  ?   Temp   ?   Temp src   ?   SpO2 95 %  ?   Weight   ?   Height   ?   Head Circumference   ?   Peak Flow   ?   Pain Score   ?   Pain Loc   ?   Pain Edu?   ?   Excl. in [place]?   ? ? ?Most recent vital signs: ?Vitals:  ? 11/17/21 0945  ?BP: 112/77  ?Pulse: 88  ?Resp: (!) 21  ?SpO2: 95%  ? ? ?General: Somnolent, awakens easily to voice will answer some questions. ?CV:  Good peripheral perfusion.  Regular rate and rhythm  ?Resp:  Normal effort.  Equal breath sounds bilaterally.  ?Abd:  No distention.  Soft, nontender.  No rebound or guarding. ?Other:  Skin feels cool to the touch. ? ? ?ED Results / Procedures / Treatments  ? ?EKG ? ?EKG viewed and interpreted by myself shows a normal sinus rhythm at 86 bpm with a narrow QRS, normal axis, normal intervals, no concerning ST changes. ? ?RADIOLOGY ? ?I personally viewed the chest x-ray images, no acute abnormality seen on my evaluation. ?Radiologist read the chest x-ray is negative. ?CT head negative for acute abnormality ? ?MEDICATIONS ORDERED IN ED: ?Medications - No data to display ? ? ?IMPRESSION /  MDM / ASSESSMENT AND PLAN / ED COURSE  ?I reviewed the triage vital signs and the nursing notes. ? ?Patient presents to the emergency department for possible overdose.  Patient received 2 mg of Narcan prior to arrival on arrival he is somnolent but will awaken to voice will briefly answer some questions but chooses not to answer others.  We will continue to closely monitor in the emergency department.  We will check labs, chest x-ray and IV hydrate while awaiting results. ? ?Patient's work-up is overall reassuring.  Chest x-ray negative.  Lab work largely [place] besides a cocaine and cannabinoid urine drug screen.  CBC shows slight leukocytosis.  Chemistry shows mild renal insufficiency.  Ethanol negative.  Patient remains somnolent will awaken to voice briefly and then falls back asleep.  Significant other is here who states patient does have a history of substance abuse.  We will obtain a CT scan of the head as a precaution.  We will continue to closely monitor. ? ?CT scan head is negative.  We will dose 1 mg of IV Narcan. ? ?After 1 mg of Narcan patient is  awake and shivering stating that he is cold.  Patient is under a Retail banker.  Is much more responsive now.  Highly suspect substance use to be the cause of the patient's somnolence earlier.  Patient is asking if he can go home.  I discussed with the patient overall findings as well as he would need to call someone to come pick him up.  Patient is agreeable and is requesting a phone to call a family member. ? ? ?FINAL CLINICAL IMPRESSION(S) / ED DIAGNOSES  ? ?Altered mental status ?Substance use disorder ?Accidental overdose ? ?Rx / DC Orders  ? ?Accidental overdose ?Substance use ? ?Note:  This document was prepared using Dragon voice recognition software and may include unintentional dictation errors. ?  ?Harvest Dark, MD ?11/17/21 1340 ? ?

## 2021-11-17 NOTE — ED Notes (Signed)
Bair Hugger applied - set at [employer] ?

## 2021-11-17 NOTE — ED Notes (Signed)
Pt c/o being too hot - bair hugger removed ?

## 2021-11-17 NOTE — ED Triage Notes (Signed)
Pt ems from home for possible drug overdose. Per ems pt was last seen well at approximately 2030 last night and was found this am by mom unresponsive. Medical illustrator. gave 2 mg narcan on scene and pt improved some. On arrival able to arouse pt but he is sleepy and confused, 97 % on room air. ?

## 2023-08-24 ENCOUNTER — Emergency Department
Admission: EM | Admit: 2023-08-24 | Discharge: 2023-08-24 | Disposition: A | Discharge status: Home / Self Care | Attending: Emergency Medicine | Admitting: Emergency Medicine

## 2023-08-24 ENCOUNTER — Other Ambulatory Visit: Payer: Self-pay

## 2023-08-24 ENCOUNTER — Emergency Department

## 2023-08-24 DIAGNOSIS — N433 Hydrocele, unspecified: Secondary | ICD-10-CM | POA: Insufficient documentation

## 2023-08-24 DIAGNOSIS — N50812 Left testicular pain: Secondary | ICD-10-CM | POA: Diagnosis present

## 2023-08-24 LAB — CBC WITH DIFFERENTIAL/PLATELET
Abs Immature Granulocytes: 0.03 10*3/uL (ref 0.00–0.07)
Basophils Absolute: 0.1 10*3/uL (ref 0.0–0.1)
Basophils Relative: 1 %
Eosinophils Absolute: 0.2 10*3/uL (ref 0.0–0.5)
Eosinophils Relative: 2 %
HCT: 49.7 % (ref 39.0–52.0)
Hemoglobin: 16.8 g/dL (ref 13.0–17.0)
Immature Granulocytes: 0 %
Lymphocytes Relative: 18 %
Lymphs Abs: 2 10*3/uL (ref 0.7–4.0)
MCH: 29.7 pg (ref 26.0–34.0)
MCHC: 33.8 g/dL (ref 30.0–36.0)
MCV: 88 fL (ref 80.0–100.0)
Monocytes Absolute: 1.5 10*3/uL — ABNORMAL HIGH (ref 0.1–1.0)
Monocytes Relative: 14 %
Neutro Abs: 7.5 10*3/uL (ref 1.7–7.7)
Neutrophils Relative %: 65 %
Platelets: 361 10*3/uL (ref 150–400)
RBC: 5.65 MIL/uL (ref 4.22–5.81)
RDW: 13.3 % (ref 11.5–15.5)
WBC: 11.3 10*3/uL — ABNORMAL HIGH (ref 4.0–10.5)
nRBC: 0 % (ref 0.0–0.2)

## 2023-08-24 LAB — URINALYSIS, ROUTINE W REFLEX MICROSCOPIC
Bacteria, UA: NONE SEEN
Bilirubin Urine: NEGATIVE
Glucose, UA: NEGATIVE mg/dL
Hgb urine dipstick: NEGATIVE
Ketones, ur: NEGATIVE mg/dL
Leukocytes,Ua: NEGATIVE
Nitrite: NEGATIVE
Protein, ur: NEGATIVE mg/dL
Specific Gravity, Urine: 1.027 (ref 1.005–1.030)
pH: 5 (ref 5.0–8.0)

## 2023-08-24 LAB — BASIC METABOLIC PANEL
Anion gap: 8 (ref 5–15)
BUN: 29 mg/dL — ABNORMAL HIGH (ref 8–23)
CO2: 21 mmol/L — ABNORMAL LOW (ref 22–32)
Calcium: 9.1 mg/dL (ref 8.9–10.3)
Chloride: 108 mmol/L (ref 98–111)
Creatinine, Ser: 0.81 mg/dL (ref 0.61–1.24)
GFR, Estimated: >60 mL/min (ref >60)
Glucose, Bld: 98 mg/dL (ref 70–99)
Potassium: 3.8 mmol/L (ref 3.5–5.1)
Sodium: 137 mmol/L (ref 135–145)

## 2023-08-24 LAB — CHLAMYDIA/NGC RT PCR (ARMC ONLY)
Chlamydia Tr: NOT DETECTED
N gonorrhoeae: NOT DETECTED

## 2023-08-24 MED ORDER — KETOROLAC TROMETHAMINE 60 MG/2ML IM SOLN
30.0000 mg | Freq: Once | INTRAMUSCULAR | Status: AC
  Administered 2023-08-24: 30 mg via INTRAMUSCULAR
  Filled 2023-08-24: qty 2

## 2023-08-24 NOTE — Discharge Instructions (Signed)
You may take Tylenol and/or Ibuprofen as needed for discomfort.  Wear athletic supporter while awake.  Elevate affected area while at rest.  Return to the ER for worsening symptoms, persistent vomiting, difficulty breathing or other concerns.

## 2023-08-24 NOTE — ED Triage Notes (Addendum)
Pt reports noticing scrotal edema that began today, pt reports some difficulty urinating. No hx of same. Pt under custody of [facility]

## 2023-08-24 NOTE — ED Provider Notes (Signed)
[facility] Provider Note    Event Date/Time   First MD Initiated Contact with Patient 08/24/23 [phone]     (approximate)   History   Groin Swelling   HPI  Frederick Warren is a 62 y.o. male who [facility] with a chief complaint of nontraumatic testicular swelling which began yesterday.  Patient reports left-sided swelling and pain.  Denies assault/trauma/injury.  Denies associated fever/chills, chest pain, shortness of breath, abdominal pain, nausea or vomiting.  Reports some difficulty urinating but still able to urinate.  Denies urethral discharge or STD concerns.     Past Medical History   Past Medical History:  Diagnosis Date   History of substance use      Active Problem List   Patient Active Problem List   Diagnosis Date Noted   Swelling of left half of scrotum 08/05/2020   Chronic neck pain 02/13/2020   Chronic bilateral low back pain without sciatica 02/13/2020   Post-traumatic osteoarthritis of multiple joints 02/13/2020   History of cervical spinal surgery 09/23/2017   History of cervical spine trauma 09/23/2017     Past Surgical History   Past Surgical History:  Procedure Laterality Date   ODONTOID SCREW INSERTION N/A 09/27/2017   Procedure: ODONTOID SCREW;  Surgeon: Ditty, [name], MD;  Location: [facility] OR;  Service: Neurosurgery;  Laterality: N/A;     Home Medications   Prior to Admission medications   Not on File     Allergies  Iodides, Other, and Shellfish-derived products   Family History   Family History  Problem Relation Age of Onset   Colon cancer Neg Hx    Prostate cancer Neg Hx      Physical Exam  Triage Vital Signs: ED Triage Vitals  Encounter Vitals Group     BP 08/24/23 0050 115/87     Systolic BP Percentile --      Diastolic BP Percentile --      Pulse Rate 08/24/23 0050 97     Resp 08/24/23 0050 18     Temp 08/24/23 0050 98 F (36.7 C)     Temp Source 08/24/23 0050 Oral      SpO2 08/24/23 0050 100 %     Weight 08/24/23 0048 160 lb (72.6 kg)     Height 08/24/23 0048 5\' 7"  (1.702 m)     Head Circumference --      Peak Flow --      Pain Score 08/24/23 0048 5     Pain Loc --      Pain Education --      Exclude from Growth Chart --     Updated Vital Signs: BP 115/87   Pulse 97   Temp 98 F (36.7 C) (Oral)   Resp 18   Ht 5\' 7"  (1.702 m)   Wt 72.6 kg   SpO2 100%   BMI 25.06 kg/m    General: Awake, no distress.  CV:  RRR.  Good peripheral perfusion.  Resp:  Normal effort.  CTAB. Abd:  Nontender to light or deep palpation.  No distention.  Other:  GU: Circumcised male.  No urethral discharge.  Plum sized area of swelling left testicle.  Strong bilateral cremasteric reflexes.   ED Results / Procedures / Treatments  Labs (all labs ordered are listed, but only abnormal results are displayed) Labs Reviewed  CBC WITH DIFFERENTIAL/PLATELET - Abnormal; Notable for the following components:      Result Value  WBC 11.3 (*)    Monocytes Absolute 1.5 (*)    All other components within normal limits  BASIC METABOLIC PANEL - Abnormal; Notable for the following components:   CO2 21 (*)    BUN 29 (*)    All other components within normal limits  URINALYSIS, ROUTINE W REFLEX MICROSCOPIC - Abnormal; Notable for the following components:   Color, Urine YELLOW (*)    APPearance HAZY (*)    All other components within normal limits  CHLAMYDIA/NGC RT PCR ([facility] ONLY)               EKG  None   RADIOLOGY Have independently visualized and interpreted patient's imaging study as well as noted the radiology interpretation:  Ultrasound scrotum: Large left hydrocele  Official radiology report(s): US SCROTUM W/DOPPLER Result Date: 08/24/2023 CLINICAL DATA:  Testicular swelling. EXAM: SCROTAL ULTRASOUND DOPPLER ULTRASOUND OF THE TESTICLES TECHNIQUE: Complete ultrasound examination of the testicles, epididymis, and other scrotal structures was performed. Color  and spectral Doppler ultrasound were also utilized to evaluate blood flow to the testicles. COMPARISON:  None Available. FINDINGS: Right testicle Measurements: 4 x 1.9 x 3.1 cm.  No mass or abnormal vascularity Left testicle Measurements: 4.6 x 1.6 x 2.6 cm. Is no mass or abnormal vascularity Right epididymis:  Normal in size and appearance. Left epididymis: Not clearly seen, likely due to distortion and obscuration from large hydrocele. Hydrocele: There is a large left hydrocele, not entirely visualized on a single image due to size, at least 7.2 cm. There are some internal echoes but no significant internal complexity. Varicocele:  None visualized. Pulsed Doppler interrogation of both testes demonstrates normal low resistance arterial and venous waveforms bilaterally. IMPRESSION: Large left hydrocele. Electronically Signed   By: Tiburcio Pea M.D.   On: 08/24/2023 04:16     PROCEDURES:  Critical Care performed: No  Procedures   MEDICATIONS ORDERED IN ED: Medications  ketorolac (TORADOL) injection 30 mg (has no administration in time range)     IMPRESSION / MDM / ASSESSMENT AND PLAN / ED COURSE  I reviewed the triage vital signs and the nursing notes.                             62 year old male presenting with testicular swelling. Differential diagnosis includes, but is not limited to, acute appendicitis, renal colic, testicular torsion, urinary tract infection/pyelonephritis, prostatitis,  epididymitis, diverticulitis, small bowel obstruction or ileus, colitis, abdominal aortic aneurysm, gastroenteritis, hernia, etc. I have personally reviewed patient's records and note and infectious disease screening exam from 08/05/2020.  It is noted patient had left scrotal swelling at that time.  Patient's presentation is most consistent with acute complicated illness / injury requiring diagnostic workup.  Laboratory and urinalysis results unremarkable.  STD negative.  Large left hydrocele noted on  ultrasound.  Will administer IM Ketorolac, place on athletic supporter and patient will follow-up with urology as needed.  Strict return precautions given.  Patient verbalizes understanding and agrees with plan of care.    FINAL CLINICAL IMPRESSION(S) / ED DIAGNOSES   Final diagnoses:  Hydrocele, unspecified hydrocele type     Rx / DC Orders   ED Discharge Orders     None        Note:  This document was prepared using Dragon voice recognition software and may include unintentional dictation errors.   Irean Hong, MD 08/24/23 [phone]
